# Patient Record
Sex: Female | Born: 1950 | Race: White | Hispanic: No | Marital: Married | Smoking: Never smoker
Health system: Southern US, Community
[De-identification: ages and names within clinical notes are randomized; demographics above are authoritative.]

## PROBLEM LIST (undated history)

## (undated) DIAGNOSIS — F419 Anxiety disorder, unspecified: Secondary | ICD-10-CM

## (undated) HISTORY — PX: COLON SURGERY: SHX602

## (undated) HISTORY — PX: TUBAL LIGATION: SHX77

## (undated) HISTORY — DX: Anxiety disorder, unspecified: F41.9

---

## 2002-11-29 ENCOUNTER — Encounter: Payer: Self-pay | Admitting: Orthopedic Surgery

## 2002-12-03 ENCOUNTER — Encounter: Payer: Self-pay | Admitting: Orthopedic Surgery

## 2008-01-21 ENCOUNTER — Ambulatory Visit (HOSPITAL_COMMUNITY): Admission: RE | Admit: 2008-01-21 | Discharge: 2008-01-21 | Payer: Self-pay | Admitting: Family Medicine

## 2009-02-28 ENCOUNTER — Ambulatory Visit (HOSPITAL_COMMUNITY): Admission: RE | Admit: 2009-02-28 | Discharge: 2009-02-28 | Payer: Self-pay | Admitting: Family Medicine

## 2009-07-24 ENCOUNTER — Ambulatory Visit: Payer: Self-pay | Admitting: Orthopedic Surgery

## 2009-07-24 DIAGNOSIS — M25819 Other specified joint disorders, unspecified shoulder: Secondary | ICD-10-CM | POA: Insufficient documentation

## 2009-07-24 DIAGNOSIS — M7580 Other shoulder lesions, unspecified shoulder: Secondary | ICD-10-CM

## 2010-02-27 NOTE — Assessment & Plan Note (Signed)
Summary: rt shoulder pain needs xr/cigna/bsf   Vital Signs:  Patient profile:   60 year old female Height:      66 inches Weight:      189 pounds Pulse rate:   78 / minute Resp:     16 per minute  Vitals Entered By: Fuller Canada MD (July 24, 2009 11:52 AM)  Visit Type:  new patient Referring Provider:  self Primary Provider:  Dr. Sudie Bailey  CC:  right shoulder pain.  History of Present Illness: 60 year old female with RIGHT shoulder pain primarily anteriorly associated with internal rotation and certain activities at work which include lifting up to the shoulder level and above  Pain present for 2 months  Pain described as sharp dull throbbing stabbing burning and radiates down into her RIGHT upper forearm and comes and goes appears to be exacerbated by activity  Denies arm weakness denies numbness does complain of some intermittent neck stiffness  Status post RIGHT proximal humerus fracture treated with physical therapy and a sling this was several years ago   Allergies (verified): 1)  ! 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  Review of Systems Constitutional:  Denies weight loss, weight gain, fever, chills, and fatigue. Cardiovascular:  Denies chest pain, palpitations, fainting, and murmurs. Respiratory:  Denies short of breath, wheezing, couch, tightness, pain on inspiration, and snoring . Gastrointestinal:  Complains of heartburn; denies nausea, vomiting, diarrhea, constipation, and blood in your stools. Genitourinary:  Denies frequency, urgency, difficulty urinating, painful urination, flank pain, and bleeding in urine. Neurologic:  Denies numbness, tingling, unsteady gait, dizziness, tremors, and  seizure. Musculoskeletal:  Denies joint pain, swelling, instability, stiffness, redness, heat, and muscle pain. Endocrine:  Denies excessive thirst, exessive urination, and heat or cold intolerance. Psychiatric:  Denies nervousness, depression, anxiety, and hallucinations. Skin:  Denies changes in the skin, poor healing, rash, itching, and redness. HEENT:  Denies blurred or double vision, eye pain, redness, and watering. Immunology:  Denies seasonal allergies, sinus problems, and allergic to bee stings. Hemoatologic:  Denies easy bleeding and brusing.  Physical Exam  Msk:  general appearance was normal  Vital signs are stable   Pulses:  observation and palpation revealed no abnormalities in the RIGHT or LEFT upper extremity pulses   Extremities:  RIGHT upper extremity tenderness at the posterior subacromial space an anterolateral acromion.  A.c. joint was nontender.  Patient had full range of motion with painful forward elevation after 150.  Rotator cuff strength is 5 over 5 with abduction forward elevation and internal and external rotation.  She did have some pain with abduction external rotation as well.  Had n Neurologic:  reflexes and coordination were normal.  No sensory deficits were noted in either arm. Skin:  intact without lesions or rashes Psych:  alert and cooperative; normal mood and affect; normal attention span and concentration   Impression & Recommendations:  Problem # 1:  IMPINGEMENT SYNDROME (ICD-726.2) Assessment New  AP lateral shoulder shows a flat acromion with a normal Roman arch of the shoulder glenohumeral joint normal  Subacromial injection  Recommend range of motion exercises and anti-inflammatory and a pain reliever  Orders: New Patient Level III (04540) Shoulder x-ray,  minimum 2 views (98119) Joint  Aspirate / Injection, Large (20610) Depo- Medrol 40mg  (J1030)  Medications Added to Medication List This Visit: 1)  Norco 5-325 Mg Tabs  (Hydrocodone-acetaminophen) .Marland Kitchen.. 1 q 6 prn pain 2)  Nabumetone 500 Mg Tabs (Nabumetone) .Marland Kitchen.. 1 by mouth two times a day  Patient Instructions: 1)  You have received an injection of cortisone today. You may experience increased pain at the injection site. Apply ice pack to the area for 20 minutes every 2 hours and take 2 xtra strength tylenol every 8 hours. This increased pain will usually resolve in 24 hours. The injection will take effect in 3-10 days.  2)  Diagnosis: Rotator cuff syndrome/bursitis tendonitis  Prescriptions: NABUMETONE 500 MG TABS (NABUMETONE) 1 by mouth two times a day  #60 x 1   Entered and Authorized by:   Fuller Canada MD   Signed by:   Fuller Canada MD on 07/24/2009   Method used:   Print then Give to Patient   RxID:   [id number] NORCO 5-325 MG TABS (HYDROCODONE-ACETAMINOPHEN) 1 q 6 prn pain  #56 x 1   Entered and Authorized by:   Fuller Canada MD   Signed by:   Fuller Canada MD on 07/24/2009   Method used:   Print then Give to Patient   RxID:   [id number]

## 2010-02-27 NOTE — Progress Notes (Signed)
Summary: Progress note  Progress note   Imported By: Jacklynn Ganong 07/24/2009 11:43:25  _____________________________________________________________________  External Attachment:    Type:   Image     Comment:   External Document

## 2010-02-27 NOTE — Letter (Signed)
Summary: History form  History form   Imported By: Jacklynn Ganong 07/25/2009 11:59:04  _____________________________________________________________________  External Attachment:    Type:   Image     Comment:   External Document

## 2010-02-27 NOTE — Progress Notes (Signed)
Summary: Initial evaluation  Initial evaluation   Imported By: Jacklynn Ganong 07/24/2009 11:40:15  _____________________________________________________________________  External Attachment:    Type:   Image     Comment:   External Document

## 2010-03-24 ENCOUNTER — Emergency Department (HOSPITAL_COMMUNITY)
Admission: EM | Admit: 2010-03-24 | Discharge: 2010-03-24 | Disposition: A | Discharge status: Home / Self Care | Attending: Emergency Medicine | Admitting: Emergency Medicine

## 2010-03-24 ENCOUNTER — Emergency Department (HOSPITAL_COMMUNITY)

## 2010-03-24 DIAGNOSIS — W268XXA Contact with other sharp object(s), not elsewhere classified, initial encounter: Secondary | ICD-10-CM | POA: Insufficient documentation

## 2010-03-24 DIAGNOSIS — S61409A Unspecified open wound of unspecified hand, initial encounter: Secondary | ICD-10-CM | POA: Insufficient documentation

## 2010-03-24 DIAGNOSIS — Y9269 Other specified industrial and construction area as the place of occurrence of the external cause: Secondary | ICD-10-CM | POA: Insufficient documentation

## 2010-03-24 DIAGNOSIS — S61209A Unspecified open wound of unspecified finger without damage to nail, initial encounter: Secondary | ICD-10-CM | POA: Insufficient documentation

## 2010-03-24 DIAGNOSIS — W01119A Fall on same level from slipping, tripping and stumbling with subsequent striking against unspecified sharp object, initial encounter: Secondary | ICD-10-CM | POA: Insufficient documentation

## 2012-01-20 ENCOUNTER — Ambulatory Visit (INDEPENDENT_AMBULATORY_CARE_PROVIDER_SITE_OTHER): Payer: Managed Care, Other (non HMO) | Admitting: Physician Assistant

## 2012-01-20 VITALS — BP 114/78 | HR 87 | Temp 98.0°F | Resp 16 | Ht 67.0 in | Wt 184.6 lb

## 2012-01-20 DIAGNOSIS — R059 Cough, unspecified: Secondary | ICD-10-CM

## 2012-01-20 DIAGNOSIS — J069 Acute upper respiratory infection, unspecified: Secondary | ICD-10-CM

## 2012-01-20 DIAGNOSIS — R05 Cough: Secondary | ICD-10-CM

## 2012-01-20 MED ORDER — HYDROCOD POLST-CHLORPHEN POLST 10-8 MG/5ML PO LQCR: 5.0000 mL | Freq: Two times a day (BID) | ORAL | Status: DC | PRN

## 2012-01-20 MED ORDER — IPRATROPIUM BROMIDE 0.03 % NA SOLN: 2.0000 | Freq: Two times a day (BID) | NASAL | Status: DC

## 2012-01-20 MED ORDER — AMOXICILLIN 875 MG PO TABS: 1750.0000 mg | ORAL_TABLET | Freq: Two times a day (BID) | ORAL | Status: DC

## 2012-01-20 NOTE — Patient Instructions (Signed)
Get plenty of rest and drink at least 64 ounces of water daily. 

## 2012-01-20 NOTE — Progress Notes (Signed)
  Subjective:    Patient ID: Amber Garcia, female    DOB: [birth year], 60 y.o.   MRN: [id number]  HPI This 61 y.o. female presents for evaluation of a cough 5 days ago.  Occasionally productive of foul tasting mucous.  Developed fever yesterday, TMax 101.  Has developed a sore throat with coughing, minimal nasal congestion.  No GU symptoms.  Had some mild dysuria yesterday, but resolved with cranberry juice.   Past Medical History  Diagnosis Date  . Anxiety     Past Surgical History  Procedure Date  . Tubal ligation   . Cesarean section     x2    Prior to Admission medications   Medication Sig Start Date End Date Taking? Authorizing Provider  dextromethorphan 15 MG/5ML syrup Take 10 mLs by mouth 4 (four) times daily as needed.   Yes Historical Provider, MD  dextromethorphan-guaiFENesin (MUCINEX DM) 30-600 MG per 12 hr tablet Take 1 tablet by mouth every 12 (twelve) hours.   Yes Historical Provider, MD  ibuprofen (ADVIL,MOTRIN) 200 MG tablet Take 200 mg by mouth every 6 (six) hours as needed.   Yes Historical Provider, MD  LORazepam (ATIVAN) 1 MG tablet Take 1 mg by mouth every 8 (eight) hours.   Yes Historical Provider, MD    Allergies  Allergen Reactions  . Influenza Vaccines     Severe local reaction    History   Social History  . Marital Status: Married    Spouse Name: Karren Burly    Number of Children: 4  . 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 Smoking status: Never Smoker   . Smokeless tobacco: Never Used  . Alcohol Use: No  . Drug Use: No  . Sexually Active: Yes -- Female partner(s)    Birth Control/ Protection: Surgical, Post-menopausal   Other Topics Concern  . Not on file   Social History Narrative   Lives with her husband and their 5 dogs.    Family History  Problem Relation Age of Onset  . COPD Mother   . Cancer Mother   . Cancer Maternal Grandmother   . Heart disease Maternal Grandfather      Review of Systems As above.    Objective:   Physical Exam  Blood pressure 114/78, pulse 87, temperature 98 F (36.7 C), temperature source Oral, resp. rate 16, height 5\' 7"  (1.702 m), weight 184 lb 9.6 oz (83.734 kg), SpO2 96.00%. Body mass index is 28.91 kg/(m^2). Well-developed, well nourished WF who is awake, alert and oriented, in NAD. Accompanied by her husband, who insisted she come in today. HEENT: /AT, PERRL, EOMI.  Sclera and conjunctiva are clear.  EAC are patent, TMs are normal in appearance. Nasal mucosa is pink and moist. OP is clear. Tenderness with palpation over the maxillary sinuses. Neck: supple, non-tender, no lymphadenopathy, thyromegaly. Heart: RRR, no murmur Lungs: normal effort, CTA Extremities: no cyanosis, clubbing or edema. Skin: warm and dry without rash. Psychologic: good mood and appropriate affect, normal speech and behavior.     Assessment & Plan:   1. Cough  ipratropium (ATROVENT) 0.03 % nasal spray, chlorpheniramine-HYDROcodone (TUSSIONEX PENNKINETIC ER) 10-8 MG/5ML LQCR, amoxicillin (AMOXIL) 875 MG tablet  2. Acute upper respiratory infections of unspecified site  ipratropium (ATROVENT) 0.03 % nasal spray   Supportive care. Anticipatory guidance.

## 2013-12-29 ENCOUNTER — Telehealth: Payer: Self-pay | Admitting: Orthopedic Surgery

## 2013-12-29 NOTE — Telephone Encounter (Signed)
Relayed to patient, per Dr Mort SawyersHarrison's response.  Will do as soon as she receives the information from Department of Labor.

## 2013-12-29 NOTE — Telephone Encounter (Signed)
Let me know when yiu have all the info and this will have to be scheduled on a non clinic day

## 2013-12-29 NOTE — Telephone Encounter (Signed)
Patient called to request an appointment for re-evaluation of work-related injury from approximately 02/14/93, right arm fracture, which she injured on her job with the [employer].  She relates that she had seen Dr Romeo AppleHarrison for this injury at his [facility].  Also states that the Worker's comp case is closed; however, she has been made aware of a possible rating she may qualify for, through the Department of Labor, due to some permanent impairment or limitations from the injury.  She is still employed with the [employer], [employer], and her insurance is [employer] engineer([employer]).  She also states that the union representative for Department of Labor will be sending her the guidelines and criteria.  I relayed to patient that Dr Romeo AppleHarrison will need to review and advise, prior to scheduling appointment.  Patient ph# is [phone].

## 2014-05-11 ENCOUNTER — Ambulatory Visit (INDEPENDENT_AMBULATORY_CARE_PROVIDER_SITE_OTHER): Payer: 59 | Admitting: Orthopedic Surgery

## 2014-05-11 ENCOUNTER — Encounter: Payer: Self-pay | Admitting: Orthopedic Surgery

## 2014-05-11 VITALS — BP 111/76 | Ht 67.0 in | Wt 181.0 lb

## 2014-05-11 DIAGNOSIS — S42201S Unspecified fracture of upper end of right humerus, sequela: Secondary | ICD-10-CM | POA: Diagnosis not present

## 2014-05-11 NOTE — Progress Notes (Signed)
Chief complaint the patient needs reevaluation for an injury sustained regarding her right shoulder which she got a reading from the [employer] [place] industrial commission rating system of 2.5% secondary to fracture  She now needs an AMA guide sixth addition type rating.  She complains of catching locking stiffness giving out burning aching pain in the right shoulder and activities of daily living are difficult such as reaching above her head or carrying weight or pushing weight above her head she has trouble typing or reaching out away from her area she has some trouble supinating her forearm which may or may not be related  She takes naproxen at this point for shoulder pain her pain ranges from 0-10 she been on naproxen the last 3 months prior to that she was on ibuprofen 3-4 tablets at a time  As far as her examination goes we will list this as a right to left comparison  Forward elevation 150/180 External rotation 30/50 Internal rotation T11/T7 Motor strength supraspinatus 4 right/5 left  I will need to get a sixth edition guidelines to rate this based on the above information and any other information which I will save that is pertinent to the case  Additional information which may or not be pertinent review of system redness of her eyes tingling in the arms seasonal allergies anxiety  Flu shot caused some swelling and fever  Family history COPD emphysema pneumonia history of fractures and cancer  No smoking or drinking  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

## 2014-06-14 NOTE — H&P (Signed)
  NTS SOAP Note  Vital Signs:  Vitals as of: 06/14/2014: Systolic 123: Diastolic 74: Heart Rate 76: Temp 97.41F: Height 335ft 7in: Weight 175Lbs 0 Ounces: BMI 27.41  BMI : 27.41 kg/m2  Subjective: This 64 year old female presents for of need for screening TCS.  Never has had one.  No family h/o colon carcinoma.  Denies any gi complaints.  Review of Symptoms:  Constitutional:unremarkable   Head:unremarkable Eyes:unremarkable   sinus problems Cardiovascular:  unremarkable Respiratory:unremarkable Gastrointestinal:  unremarkable   Genitourinary:unremarkable   Musculoskeletal:unremarkable Skin:unremarkable Hematolgic/Lymphatic:unremarkable   Allergic/Immunologic:unremarkable   Past Medical History:  Reviewed  Past Medical History  Surgical History: c-sections Medical Problems: high cholesterol, anxiety disorder Allergies: flu shot Medications: lorazepam, naproxen   Social History:Reviewed  Social History  Preferred Language: English Race:  White Ethnicity: Not Hispanic / Latino Age: [age] year Marital Status:  M Alcohol: no   Smoking Status: Never smoker reviewed on 06/14/2014 Functional Status reviewed on 06/14/2014 ------------------------------------------------ Bathing: Normal Cooking: Normal Dressing: Normal Driving: Normal Eating: Normal Managing Meds: Normal Oral Care: Normal Shopping: Normal Toileting: Normal Transferring: Normal Walking: Normal Cognitive Status reviewed on 06/14/2014 ------------------------------------------------ Attention: Normal Decision Making: Normal Language: Normal Memory: Normal Motor: Normal Perception: Normal Problem Solving: Normal Visual and Spatial: Normal   Family History:Reviewed  Family Health History Family History is Unknown    Objective Information: General:Well appearing, well nourished in no distress. Heart:RRR, no murmur Lungs:  CTA bilaterally, no wheezes, rhonchi,  rales.  Breathing unlabored. Abdomen:Soft, NT/ND, no HSM, no masses. deferred to procedure  Assessment:Need for screening TCS  Diagnoses: V76.51  Z12.11 Screening for malignant neoplasm of colon (Encounter for screening for malignant neoplasm of colon)  Procedures: [id number] - OFFICE OUTPATIENT NEW 20 MINUTES    Plan:  Schedule for TCS on 06/28/14.   Patient Education:Alternative treatments to surgery were discussed with patient (and family).  Risks and benefits  of procedure including bleeding and perforation were fully explained to the patient (and family) who gave informed consent. Patient/family questions were addressed.  Follow-up:Pending Surgery

## 2014-06-28 ENCOUNTER — Ambulatory Visit (HOSPITAL_COMMUNITY)
Admission: RE | Admit: 2014-06-28 | Discharge: 2014-06-28 | Disposition: A | Discharge status: Home / Self Care | Payer: 59 | Source: Ambulatory Visit | Attending: General Surgery | Admitting: General Surgery

## 2014-06-28 ENCOUNTER — Encounter (HOSPITAL_COMMUNITY)
Admission: RE | Disposition: A | Discharge status: Home / Self Care | Payer: Self-pay | Source: Ambulatory Visit | Attending: General Surgery

## 2014-06-28 ENCOUNTER — Encounter (HOSPITAL_COMMUNITY): Payer: Self-pay | Admitting: *Deleted

## 2014-06-28 DIAGNOSIS — Z1211 Encounter for screening for malignant neoplasm of colon: Secondary | ICD-10-CM | POA: Insufficient documentation

## 2014-06-28 DIAGNOSIS — F419 Anxiety disorder, unspecified: Secondary | ICD-10-CM | POA: Diagnosis not present

## 2014-06-28 DIAGNOSIS — K5730 Diverticulosis of large intestine without perforation or abscess without bleeding: Secondary | ICD-10-CM | POA: Diagnosis not present

## 2014-06-28 DIAGNOSIS — Z800 Family history of malignant neoplasm of digestive organs: Secondary | ICD-10-CM | POA: Insufficient documentation

## 2014-06-28 DIAGNOSIS — Z791 Long term (current) use of non-steroidal anti-inflammatories (NSAID): Secondary | ICD-10-CM | POA: Diagnosis not present

## 2014-06-28 DIAGNOSIS — Z79899 Other long term (current) drug therapy: Secondary | ICD-10-CM | POA: Diagnosis not present

## 2014-06-28 HISTORY — PX: COLONOSCOPY: SHX5424

## 2014-06-28 SURGERY — COLONOSCOPY
Anesthesia: Moderate Sedation

## 2014-06-28 MED ORDER — MIDAZOLAM HCL 5 MG/5ML IJ SOLN
INTRAMUSCULAR | Status: DC | PRN
  Administered 2014-06-28: 3 mg via INTRAVENOUS

## 2014-06-28 MED ORDER — MIDAZOLAM HCL 5 MG/5ML IJ SOLN
INTRAMUSCULAR | Status: AC
  Filled 2014-06-28: qty 10

## 2014-06-28 MED ORDER — SODIUM CHLORIDE 0.9 % IV SOLN
INTRAVENOUS | Status: DC
  Administered 2014-06-28: 08:00:00 via INTRAVENOUS

## 2014-06-28 MED ORDER — MEPERIDINE HCL 50 MG/ML IJ SOLN
INTRAMUSCULAR | Status: AC
  Filled 2014-06-28: qty 1

## 2014-06-28 MED ORDER — MEPERIDINE HCL 50 MG/ML IJ SOLN
INTRAMUSCULAR | Status: DC | PRN
  Administered 2014-06-28: 50 mg via INTRAVENOUS

## 2014-06-28 NOTE — Op Note (Signed)
[facility] [address] [place], [id number]   COLONOSCOPY PROCEDURE REPORT     EXAM DATE: 06/28/2014  PATIENT NAME:      Amber Garcia, Amber Garcia           MR #:      [id number] BIRTHDATE:       Jun 17, 1950      VISIT #:     [id number]  ATTENDING:     Franky MachoMark Enrrique Mierzwa, MD     STATUS:     outpatient ASSISTANT:  INDICATIONS:  The patient is a 64 yr old female here for a colonoscopy due to patient's immediate family history of colon cancer. PROCEDURE PERFORMED:     Colonoscopy, screening MEDICATIONS:     Demerol 50 mg IV and Versed 3 mg IV ESTIMATED BLOOD LOSS:     None  CONSENT: The patient understands the risks and benefits of the procedure and understands that these risks include, but are not limited to: sedation, allergic reaction, infection, perforation and/or bleeding. Alternative means of evaluation and treatment include, among others: physical exam, x-rays, and/or surgical intervention. The patient elects to proceed with this endoscopic procedure.  DESCRIPTION OF PROCEDURE: During intra-op preparation period all mechanical & medical equipment was checked for proper function. Hand hygiene and appropriate measures for infection prevention was taken. After the risks, benefits and alternatives of the procedure were thoroughly explained, Informed consent was verified, confirmed and timeout was successfully executed by the treatment team. A digital exam revealed no abnormalities of the rectum. The EC-[id number] ([id number] endoscope was introduced through the anus and advanced to the cecum, which was identified by both the appendix and ileocecal valve. adequate The instrument was then slowly withdrawn as the colon was fully examined.Estimated blood loss is zero unless otherwise noted in this procedure report.   COLON FINDINGS: There was mild diverticulosis noted throughout the entire examined colon.   The examination was otherwise normal. Retroflexed views revealed no  abnormalities. The scope was then completely withdrawn from the patient and the procedure terminated.  SCOPE WITHDRAWAL TIME: 6    ADVERSE EVENTS:      There were no immediate complications.  IMPRESSIONS:     1.  Mild diverticulosis was noted throughout the entire examined colon 2.  The examination was otherwise normal  RECOMMENDATIONS:     Repeat Colonoscopy in 5 years. RECALL:  _____________________________ Franky MachoMark Felesia Stahlecker, MD eSigned:  Franky MachoMark Daritza Brees, MD 06/28/2014 8:36 AM   cc:   CPT CODES: ICD CODES:  The ICD and CPT codes recommended by this software are interpretations from the data that the clinical staff has captured with the software.  The verification of the translation of this report to the ICD and CPT codes and modifiers is the sole responsibility of the health care institution and practicing physician where this report was generated.  [employer] will not be held responsible for the validity of the ICD and CPT codes included on this report.  AMA assumes no liability for data contained or not contained herein. CPT is a Publishing rights managerregistered trademark of the [employer].

## 2014-06-28 NOTE — Interval H&P Note (Signed)
History and Physical Interval Note:  06/28/2014 8:18 AM  Amber Garcia  has presented today for surgery, with the diagnosis of screening  The various methods of treatment have been discussed with the patient and family. After consideration of risks, benefits and other options for treatment, the patient has consented to  Procedure(s): COLONOSCOPY (N/A) as a surgical intervention .  The patient's history has been reviewed, patient examined, no change in status, stable for surgery.  I have reviewed the patient's chart and labs.  Questions were answered to the patient's satisfaction.     Franky MachoJENKINS,Artesia Berkey A

## 2014-06-28 NOTE — Discharge Instructions (Signed)
Diverticulosis °Diverticulosis is the condition that develops when small pouches (diverticula) form in the wall of your colon. Your colon, or large intestine, is where water is absorbed and stool is formed. The pouches form when the inside layer of your colon pushes through weak spots in the outer layers of your colon. °CAUSES  °No one knows exactly what causes diverticulosis. °RISK FACTORS °· Being older than 50. Your risk for this condition increases with age. Diverticulosis is rare in people younger than 40 years. By age 80, almost everyone has it. °· Eating a low-fiber diet. °· Being frequently constipated. °· Being overweight. °· Not getting enough exercise. °· Smoking. °· Taking over-the-counter pain medicines, like aspirin and ibuprofen. °SYMPTOMS  °Most people with diverticulosis do not have symptoms. °DIAGNOSIS  °Because diverticulosis often has no symptoms, health care providers often discover the condition during an exam for other colon problems. In many cases, a health care provider will diagnose diverticulosis while using a flexible scope to examine the colon (colonoscopy). °TREATMENT  °If you have never developed an infection related to diverticulosis, you may not need treatment. If you have had an infection before, treatment may include: °· Eating more fruits, vegetables, and grains. °· Taking a fiber supplement. °· Taking a live bacteria supplement (probiotic). °· Taking medicine to relax your colon. °HOME CARE INSTRUCTIONS  °· Drink at least 6-8 glasses of water each day to prevent constipation. °· Try not to strain when you have a bowel movement. °· Keep all follow-up appointments. °If you have had an infection before:  °· Increase the fiber in your diet as directed by your health care provider or dietitian. °· Take a dietary fiber supplement if your health care provider approves. °· Only take medicines as directed by your health care provider. °SEEK MEDICAL CARE IF:  °· You have abdominal  pain. °· You have bloating. °· You have cramps. °· You have not gone to the bathroom in 3 days. °SEEK IMMEDIATE MEDICAL CARE IF:  °· Your pain gets worse. °· Your bloating becomes very bad. °· You have a fever or chills, and your symptoms suddenly get worse. °· You begin vomiting. °· You have bowel movements that are bloody or black. °MAKE SURE YOU: °· Understand these instructions. °· Will watch your condition. °· Will get help right away if you are not doing well or get worse. °Document Released: 10/12/2003 Document Revised: 01/19/2013 Document Reviewed: 12/09/2012 °ExitCare® Patient Information ©2015 [employer]. This information is not intended to replace advice given to you by your health care provider. Make sure you discuss any questions you have with your health care provider. °Colonoscopy, Care After °Refer to this sheet in the next few weeks. These instructions provide you with information on caring for yourself after your procedure. Your health care provider may also give you more specific instructions. Your treatment has been planned according to current medical practices, but problems sometimes occur. Call your health care provider if you have any problems or questions after your procedure. °WHAT TO EXPECT AFTER THE PROCEDURE  °After your procedure, it is typical to have the following: °· A small amount of blood in your stool. °· Moderate amounts of gas and mild abdominal cramping or bloating. °HOME CARE INSTRUCTIONS °· Do not drive, operate machinery, or sign important documents for 24 hours. °· You may shower and resume your regular physical activities, but move at a slower pace for the first 24 hours. °· Take frequent rest periods for the first 24 hours. °· Walk   around or put a warm pack on your abdomen to help reduce abdominal cramping and bloating. °· Drink enough fluids to keep your urine clear or pale yellow. °· You may resume your normal diet as instructed by your health care provider. Avoid  heavy or fried foods that are hard to digest. °· Avoid drinking alcohol for 24 hours or as instructed by your health care provider. °· Only take over-the-counter or prescription medicines as directed by your health care provider. °· If a tissue sample (biopsy) was taken during your procedure: °¨ Do not take aspirin or blood thinners for 7 days, or as instructed by your health care provider. °¨ Do not drink alcohol for 7 days, or as instructed by your health care provider. °¨ Eat soft foods for the first 24 hours. °SEEK MEDICAL CARE IF: °You have persistent spotting of blood in your stool 2-3 days after the procedure. °SEEK IMMEDIATE MEDICAL CARE IF: °· You have more than a small spotting of blood in your stool. °· You pass large blood clots in your stool. °· Your abdomen is swollen (distended). °· You have nausea or vomiting. °· You have a fever. °· You have increasing abdominal pain that is not relieved with medicine. °Document Released: 08/29/2003 Document Revised: 11/04/2012 Document Reviewed: 09/21/2012 °ExitCare® Patient Information ©2015 [employer]. This information is not intended to replace advice given to you by your health care provider. Make sure you discuss any questions you have with your health care provider. ° °

## 2014-06-29 ENCOUNTER — Encounter (HOSPITAL_COMMUNITY): Payer: Self-pay | Admitting: General Surgery

## 2014-10-04 ENCOUNTER — Telehealth: Payer: Self-pay | Admitting: Orthopedic Surgery

## 2014-10-04 NOTE — Telephone Encounter (Signed)
-----   Message from Vickki Hearing, MD sent at 09/01/2014  9:37 AM EDT ----- Regarding: RE: Any further info or letter?  No did not get a new book  ----- Message -----    From: Doristine Section    Sent: 08/31/2014   3:25 PM      To: Vickki Hearing, MD Subject: FW: Any further info or letter?                Dr Romeo Apple,  [name] called back again (6/20,  and today, 08/31/14, to follow up about rating.  Aurea Graff thought you received the new book).  Please advise.    Thanks, Okey Regal ----- Message -----    From: Vickki Hearing, MD    Sent: 07/18/2014   4:23 PM      To: Pecolia Ades Subject: RE: Any further info or letter? (pt called b#  Can you remind me to ask joan to get me this book  ----- Message -----    From: Doristine Section    Sent: 07/18/2014   2:28 PM      To: Vickki Hearing, MD Subject: RE: Any further info or letter? (pt called b#  Dr Romeo Apple,  Patient called back about this.  Asking if you would be getting any other information re: 6th edition book.  Same answer as 05/24/14?  Thank you, Okey Regal ----- Message -----    From: Vickki Hearing, MD    Sent: 05/24/2014  12:04 PM      To: Doristine Section Subject: RE: Any further info or letter?                ITS ALL IN THE CHART WHAT I COULD DO   I DO NOT HAVE A 6TH EDITION RATING BOOK AND HAVE TO WAIT TO GET ONE  ----- Message -----    From: Doristine Section    Sent: 05/24/2014   9:08 AM      To: Vickki Hearing, MD Subject: Any further info or letter?                    Dr Romeo Apple, [name] [[phone]]  Is there a letter or anything further due this patient related to rating? Thanks, Okey Regal

## 2014-10-04 NOTE — Telephone Encounter (Signed)
Patient called to follow up on status on rating per Workers comp's updated book; please advise.  Patient's cell ph# [phone]

## 2014-10-06 NOTE — Telephone Encounter (Signed)
Called patient, relayed per Dr Romeo Apple, that request will be completed by next Wednesday, 10/12/14.

## 2014-10-06 NOTE — Telephone Encounter (Signed)
i will have it done by next weds pm

## 2014-10-12 ENCOUNTER — Encounter: Payer: Self-pay | Admitting: Orthopedic Surgery

## 2015-02-12 ENCOUNTER — Ambulatory Visit (INDEPENDENT_AMBULATORY_CARE_PROVIDER_SITE_OTHER): Payer: Managed Care, Other (non HMO) | Admitting: Physician Assistant

## 2015-02-12 ENCOUNTER — Ambulatory Visit (INDEPENDENT_AMBULATORY_CARE_PROVIDER_SITE_OTHER): Payer: Managed Care, Other (non HMO)

## 2015-02-12 VITALS — BP 128/68 | HR 99 | Temp 102.8°F | Resp 16 | Ht 66.0 in | Wt 159.0 lb

## 2015-02-12 DIAGNOSIS — R6883 Chills (without fever): Secondary | ICD-10-CM | POA: Diagnosis not present

## 2015-02-12 DIAGNOSIS — R0981 Nasal congestion: Secondary | ICD-10-CM | POA: Diagnosis not present

## 2015-02-12 DIAGNOSIS — R05 Cough: Secondary | ICD-10-CM | POA: Diagnosis not present

## 2015-02-12 DIAGNOSIS — R509 Fever, unspecified: Secondary | ICD-10-CM

## 2015-02-12 DIAGNOSIS — R5381 Other malaise: Secondary | ICD-10-CM | POA: Diagnosis not present

## 2015-02-12 DIAGNOSIS — J111 Influenza due to unidentified influenza virus with other respiratory manifestations: Secondary | ICD-10-CM | POA: Diagnosis not present

## 2015-02-12 DIAGNOSIS — N390 Urinary tract infection, site not specified: Secondary | ICD-10-CM

## 2015-02-12 DIAGNOSIS — R5383 Other fatigue: Secondary | ICD-10-CM | POA: Diagnosis not present

## 2015-02-12 DIAGNOSIS — R69 Illness, unspecified: Principal | ICD-10-CM

## 2015-02-12 LAB — POCT CBC
Granulocyte percent: 84.8 %G — AB (ref 37–80)
HEMATOCRIT: 38 % (ref 37.7–47.9)
Hemoglobin: 12.9 g/dL (ref 12.2–16.2)
Lymph, poc: 1 (ref 0.6–3.4)
MCH, POC: 29.4 pg (ref 27–31.2)
MCHC: 34 g/dL (ref 31.8–35.4)
MCV: 86.5 fL (ref 80–97)
MID (CBC): 0.2 (ref 0–0.9)
MPV: 6.5 fL (ref 0–99.8)
POC Granulocyte: 6.4 (ref 2–6.9)
POC LYMPH %: 13.1 % (ref 10–50)
POC MID %: 2.1 %M (ref 0–12)
Platelet Count, POC: 155 10*3/uL (ref 142–424)
RBC: 4.4 M/uL (ref 4.04–5.48)
RDW, POC: 12.9 %
WBC: 7.5 10*3/uL (ref 4.6–10.2)

## 2015-02-12 LAB — POCT URINALYSIS DIP (MANUAL ENTRY)
Bilirubin, UA: NEGATIVE
Blood, UA: NEGATIVE
GLUCOSE UA: NEGATIVE
Nitrite, UA: POSITIVE — AB
SPEC GRAV UA: 1.02
Urobilinogen, UA: 0.2
pH, UA: 6.5

## 2015-02-12 LAB — POC MICROSCOPIC URINALYSIS (UMFC): MUCUS RE: ABSENT

## 2015-02-12 LAB — POCT INFLUENZA A/B
Influenza A, POC: NEGATIVE
Influenza B, POC: NEGATIVE

## 2015-02-12 MED ORDER — ACETAMINOPHEN 325 MG PO TABS
1000.0000 mg | ORAL_TABLET | Freq: Once | ORAL | Status: AC
  Administered 2015-02-12: 1000 mg via ORAL

## 2015-02-12 MED ORDER — CIPROFLOXACIN HCL 500 MG PO TABS: 500.0000 mg | ORAL_TABLET | Freq: Two times a day (BID) | ORAL | Status: DC

## 2015-02-12 MED ORDER — CEFTRIAXONE SODIUM 1 G IJ SOLR
1.0000 g | Freq: Once | INTRAMUSCULAR | Status: AC
  Administered 2015-02-12: 1 g via INTRAMUSCULAR

## 2015-02-12 MED ORDER — OSELTAMIVIR PHOSPHATE 75 MG PO CAPS: 75.0000 mg | ORAL_CAPSULE | Freq: Two times a day (BID) | ORAL | Status: DC

## 2015-02-12 NOTE — Progress Notes (Signed)
02/12/2015 10:54 AM   DOB: 1950-07-14 / MRN: [id number]  SUBJECTIVE:  Amber Garcia is a 65 y.o. female presenting for headache, cough, myalgia and fever that started 4 days ago.  She has tried pushing PO fluids and OTC remedies without relief.  She has not had the seasonal flu vaccine and reports that she has an allergy to flu shots.    Reports she has been having some dysuria which resolved on Wednesday with drinking cranberry juice.  No urgency, no frequency.    She is allergic to influenza vaccines.   She  has a past medical history of Anxiety.    She  reports that she has never smoked. She has never used smokeless tobacco. She reports that she does not drink alcohol or use illicit drugs. She  reports that she currently engages in sexual activity and has had female partners. She reports using the following methods of birth control/protection: Surgical and Post-menopausal. The patient  has past surgical history that includes Tubal ligation; Cesarean section; Colonoscopy (N/A, 06/28/2014); and Colon surgery.  Her family history includes COPD in her mother; Cancer in her maternal grandmother and mother; Heart disease in her maternal grandfather.  Review of Systems  Constitutional: Positive for fever, chills and malaise/fatigue. Negative for diaphoresis.  HENT: Positive for congestion. Negative for sore throat.   Respiratory: Positive for cough. Negative for hemoptysis, shortness of breath and wheezing.   Cardiovascular: Negative for chest pain.  Gastrointestinal: Negative for nausea.  Skin: Negative for rash.  Neurological: Negative for dizziness and weakness.  Endo/Heme/Allergies: Negative for polydipsia.    Problem list and medications reviewed and updated by myself where necessary, and exist elsewhere in the encounter.   OBJECTIVE:  BP 128/68 mmHg  Pulse 99  Temp(Src) 102.8 F (39.3 C) (Oral)  Resp 16  Ht 5\' 6"  (1.676 m)  Wt 159 lb (72.122 kg)  BMI 25.68 kg/m2  SpO2  97%  Physical Exam  Constitutional: She is oriented to person, place, and time. She appears well-nourished. No distress.  HENT:  Mouth/Throat: Oropharynx is clear and moist. No oropharyngeal exudate.  Eyes: EOM are normal. Pupils are equal, round, and reactive to light.  Cardiovascular: Normal rate and regular rhythm.   Pulmonary/Chest: Effort normal. No respiratory distress. She has no wheezes. She has no rales. She exhibits no tenderness.  Abdominal: She exhibits no distension.  Neurological: She is alert and oriented to person, place, and time. No cranial nerve deficit. Gait normal.  Skin: Skin is warm and dry. She is not diaphoretic.  Psychiatric: She has a normal mood and affect.  Vitals reviewed.   Results for orders placed or performed in visit on 02/12/15 (from the past 48 hour(s))  POCT Influenza A/B     Status: None   Collection Time: 02/12/15 10:08 AM  Result Value Ref Range   Influenza A, POC Negative Negative   Influenza B, POC Negative Negative  POCT CBC     Status: Abnormal   Collection Time: 02/12/15 10:20 AM  Result Value Ref Range   WBC 7.5 4.6 - 10.2 K/uL   Lymph, poc 1.0 0.6 - 3.4   POC LYMPH PERCENT 13.1 10 - 50 %L   MID (cbc) 0.2 0 - 0.9   POC MID % 2.1 0 - 12 %M   POC Granulocyte 6.4 2 - 6.9   Granulocyte percent 84.8 (A) 37 - 80 %G   RBC 4.40 4.04 - 5.48 M/uL   Hemoglobin 12.9 12.2 - 16.2  g/dL   HCT, POC 16.1 09.6 - 47.9 %   MCV 86.5 80 - 97 fL   MCH, POC 29.4 27 - 31.2 pg   MCHC 34.0 31.8 - 35.4 g/dL   RDW, POC 04.5 %   Platelet Count, POC 155 142 - 424 K/uL   MPV 6.5 0 - 99.8 fL  POCT urinalysis dipstick     Status: Abnormal   Collection Time: 02/12/15 10:20 AM  Result Value Ref Range   Color, UA yellow yellow   Clarity, UA cloudy (A) clear   Glucose, UA negative negative   Bilirubin, UA negative negative   Ketones, POC UA small (15) (A) negative   Spec Grav, UA 1.020    Blood, UA negative negative   pH, UA 6.5    Protein Ur, POC trace (A)  negative   Urobilinogen, UA 0.2    Nitrite, UA Positive (A) Negative   Leukocytes, UA small (1+) (A) Negative  POCT Microscopic Urinalysis ([facility])     Status: Abnormal   Collection Time: 02/12/15 10:44 AM  Result Value Ref Range   WBC,UR,HPF,POC Too numerous to count  (A) None WBC/hpf   RBC,UR,HPF,POC Few (A) None RBC/hpf   Bacteria Many (A) None, Too numerous to count   Mucus Absent Absent   Epithelial Cells, UR Per Microscopy Few (A) None, Too numerous to count cells/hpf   [facility] reading (PRIMARY) by  PA Raine Blodgett: Lungs clear.  Heart and mediastinal contours within normal limits.   ASSESSMENT AND PLAN  Amber Garcia was seen today for cough, fever, chills, headache and generalized body aches.  Diagnoses and all orders for this visit:  Influenza-like illness -     POCT Influenza A/B  Fever, unspecified fever cause -     acetaminophen (TYLENOL) tablet 975 mg; Take 3 tablets (975 mg total) by mouth once. -     POCT CBC -     DG Chest 2 View; Future -     POCT urinalysis dipstick -     cefTRIAXone (ROCEPHIN) injection 1 g; Inject 1 g into the muscle once.  UTI (lower urinary tract infection): Urine indicates a florid UTI.  She is asymptomatic.  Advised she return tomorrow for a recheck.  -     POCT Microscopic Urinalysis ([facility]) -     cefTRIAXone (ROCEPHIN) injection 1 g; Inject 1 g into the muscle once. -     Urine culture  Other orders -     oseltamivir (TAMIFLU) 75 MG capsule; Take 1 capsule (75 mg total) by mouth 2 (two) times daily. -     ciprofloxacin (CIPRO) 500 MG tablet; Take 1 tablet (500 mg total) by mouth 2 (two) times daily.    The patient was advised to call or return to clinic if she does not see an improvement in symptoms or to seek the care of the closest emergency department if she worsens with the above plan.   Deliah Boston, MHS, PA-C [facility] 02/12/2015 10:54 AM

## 2015-02-13 ENCOUNTER — Ambulatory Visit (INDEPENDENT_AMBULATORY_CARE_PROVIDER_SITE_OTHER): Payer: Managed Care, Other (non HMO) | Admitting: Physician Assistant

## 2015-02-13 VITALS — BP 138/80 | HR 84 | Temp 98.5°F | Resp 16 | Ht 66.0 in | Wt 156.0 lb

## 2015-02-13 DIAGNOSIS — R300 Dysuria: Secondary | ICD-10-CM | POA: Diagnosis not present

## 2015-02-13 DIAGNOSIS — R05 Cough: Secondary | ICD-10-CM | POA: Diagnosis not present

## 2015-02-13 DIAGNOSIS — E860 Dehydration: Secondary | ICD-10-CM | POA: Diagnosis not present

## 2015-02-13 DIAGNOSIS — R059 Cough, unspecified: Secondary | ICD-10-CM

## 2015-02-13 LAB — POCT URINALYSIS DIP (MANUAL ENTRY)
Blood, UA: NEGATIVE
Glucose, UA: NEGATIVE
LEUKOCYTES UA: NEGATIVE
Nitrite, UA: NEGATIVE
Spec Grav, UA: >=1.03
UROBILINOGEN UA: 0.2
pH, UA: 5.5

## 2015-02-13 LAB — POC MICROSCOPIC URINALYSIS (UMFC): Mucus: ABSENT

## 2015-02-13 LAB — POCT CBC
GRANULOCYTE PERCENT: 81.9 % — AB (ref 37–80)
HEMATOCRIT: 42.3 % (ref 37.7–47.9)
Hemoglobin: 14.4 g/dL (ref 12.2–16.2)
Lymph, poc: 1.1 (ref 0.6–3.4)
MCH: 29.2 pg (ref 27–31.2)
MCHC: 34.1 g/dL (ref 31.8–35.4)
MCV: 85.6 fL (ref 80–97)
MID (CBC): 0.5 (ref 0–0.9)
MPV: 6.7 fL (ref 0–99.8)
POC GRANULOCYTE: 7 — AB (ref 2–6.9)
POC LYMPH PERCENT: 12.8 %L (ref 10–50)
POC MID %: 5.3 % (ref 0–12)
Platelet Count, POC: 166 10*3/uL (ref 142–424)
RBC: 4.95 M/uL (ref 4.04–5.48)
RDW, POC: 12.8 %
WBC: 8.6 10*3/uL (ref 4.6–10.2)

## 2015-02-13 MED ORDER — HYDROCODONE-HOMATROPINE 5-1.5 MG/5ML PO SYRP: 2.5000 mL | ORAL_SOLUTION | Freq: Every day | ORAL | Status: AC

## 2015-02-13 NOTE — Progress Notes (Signed)
  Medical screening examination/treatment/procedure(s) were performed by non-physician practitioner and as supervising physician I was immediately available for consultation/collaboration.     

## 2015-02-13 NOTE — Progress Notes (Signed)
02/13/2015 2:30 PM   DOB: [birth year] / MRN: [id number]  SUBJECTIVE:  Amber Garcia is a 65 y.o. female presenting for a recheck of UTI and flu like illness.  Presented yesterday with fever of 102, myalgia, HA.  Negative for the flu.  Given negative flu she was screened and CBC positive for left shift.  Urine showed positive nitrites.  She was given 1 g IM Ceftriaxone and was treated with Tamiflu and Cipro.  Today she reports feeling much better.  She is not pushing PO fluids and felt somewhat dizzy after the shower this morning, however this quickly resolved. She does not want IV fluids today and will drink more fluid.   She is allergic to influenza vaccines.   She  has a past medical history of Anxiety.    She  reports that she has never smoked. She has never used smokeless tobacco. She reports that she does not drink alcohol or use illicit drugs. She  reports that she currently engages in sexual activity and has had female partners. She reports using the following methods of birth control/protection: Surgical and Post-menopausal. The patient  has past surgical history that includes Tubal ligation; Cesarean section; Colonoscopy (N/A, 06/28/2014); and Colon surgery.  Her family history includes COPD in her mother; Cancer in her maternal grandmother and mother; Heart disease in her maternal grandfather.  Review of Systems  Constitutional: Negative for fever.  Respiratory: Negative for cough.   Gastrointestinal: Negative for nausea.  Genitourinary: Negative for dysuria, urgency and frequency.  Musculoskeletal: Negative for myalgias.  Skin: Negative for rash.  Neurological: Negative for headaches.    Problem list and medications reviewed and updated by myself where necessary, and exist elsewhere in the encounter.   OBJECTIVE:  BP 138/80 mmHg  Pulse 84  Temp(Src) 98.5 F (36.9 C) (Oral)  Resp 16  Ht 5\' 6"  (1.676 m)  Wt 156 lb (70.761 kg)  BMI 25.19 kg/m2  SpO2 97%  Physical Exam    Constitutional: She is oriented to person, place, and time. She appears well-nourished. No distress.  Eyes: EOM are normal. Pupils are equal, round, and reactive to light.  Cardiovascular: Normal rate.   Pulmonary/Chest: Effort normal.  Abdominal: She exhibits no distension.  Neurological: She is alert and oriented to person, place, and time. No cranial nerve deficit. Gait normal.  Skin: Skin is dry. She is not diaphoretic.  Psychiatric: She has a normal mood and affect.  Vitals reviewed.   Results for orders placed or performed in visit on 02/13/15 (from the past 48 hour(s))  POCT Microscopic Urinalysis ([facility])     Status: Abnormal   Collection Time: 02/13/15  1:31 PM  Result Value Ref Range   WBC,UR,HPF,POC Few (A) None WBC/hpf   RBC,UR,HPF,POC None None RBC/hpf   Bacteria None None, Too numerous to count   Mucus Absent Absent   Epithelial Cells, UR Per Microscopy Few (A) None, Too numerous to count cells/hpf  POCT urinalysis dipstick     Status: Abnormal   Collection Time: 02/13/15  1:31 PM  Result Value Ref Range   Color, UA other (A) yellow    Comment: Dark Yellow   Clarity, UA clear clear   Glucose, UA negative negative   Bilirubin, UA small (A) negative   Ketones, POC UA >= (160) (A) negative   Spec Grav, UA >=1.030    Blood, UA negative negative   pH, UA 5.5    Protein Ur, POC =30 (A) negative   Urobilinogen,  UA 0.2    Nitrite, UA Negative Negative   Leukocytes, UA Negative Negative  POCT CBC     Status: Abnormal   Collection Time: 02/13/15  1:31 PM  Result Value Ref Range   WBC 8.6 4.6 - 10.2 K/uL   Lymph, poc 1.1 0.6 - 3.4   POC LYMPH PERCENT 12.8 10 - 50 %L   MID (cbc) 0.5 0 - 0.9   POC MID % 5.3 0 - 12 %M   POC Granulocyte 7.0 (A) 2 - 6.9   Granulocyte percent 81.9 (A) 37 - 80 %G   RBC 4.95 4.04 - 5.48 M/uL   Hemoglobin 14.4 12.2 - 16.2 g/dL   HCT, POC 16.1 09.6 - 47.9 %   MCV 85.6 80 - 97 fL   MCH, POC 29.2 27 - 31.2 pg   MCHC 34.1 31.8 - 35.4 g/dL    RDW, POC 04.5 %   Platelet Count, POC 166 142 - 424 K/uL   MPV 6.7 0 - 99.8 fL   Results for HAYDE, KILGOUR (MRN [id number]) as of 02/13/2015 14:25  Ref. Range 02/12/2015 10:20 02/13/2015 13:31  WBC Latest Ref Range: 4.6-10.2 K/uL 7.5 8.6  RBC Latest Ref Range: 4.04-5.48 M/uL 4.40 4.95  Hemoglobin Latest Ref Range: 12.2-16.2 g/dL 78.2 95.6  HCT Latest Ref Range: 37.7-47.9 % 38.0 42.3  MCV Latest Ref Range: 80-97 fL 86.5 85.6  MCH Latest Ref Range: 27-31.2 pg 29.4 29.2  MCHC Latest Ref Range: 31.8-35.4 g/dL 21.3 08.6  MPV Latest Ref Range: 0-99.8 fL 6.5 6.7  Granulocyte percent Latest Ref Range: 37-80 %G 84.8 (A) 81.9 (A)    ASSESSMENT AND PLAN  [name] was seen today for follow-up.  Diagnoses and all orders for this visit:  UTI recheck -     POCT Microscopic Urinalysis ([facility]) -     POCT urinalysis dipstick -     POCT CBC  Cough -     HYDROcodone-homatropine (HYCODAN) 5-1.5 MG/5ML syrup; Take 2.5-5 mLs by mouth at bedtime.  Dehydration: She does not have any nausea. She does not want an IV.  She will hydrate.      The patient was advised to call or return to clinic if she does not see an improvement in symptoms or to seek the care of the closest emergency department if she worsens with the above plan.   Deliah Boston, MHS, PA-C [facility] 02/13/2015 2:30 PM

## 2015-02-14 LAB — URINE CULTURE: Colony Count: >=100000

## 2015-02-15 ENCOUNTER — Encounter: Payer: Self-pay | Admitting: *Deleted

## 2015-02-15 ENCOUNTER — Telehealth: Payer: Self-pay

## 2015-02-15 NOTE — Telephone Encounter (Signed)
Pt. Called in to ask for an extra day because she was not still feeling well. I verified with the TL if it was ok she said it was ok and gave her the extra day. We informed her that she has to return if she is still not better if she needed more time.

## 2015-05-10 ENCOUNTER — Telehealth: Payer: Self-pay | Admitting: Orthopedic Surgery

## 2015-05-10 NOTE — Telephone Encounter (Signed)
Left message with patient to return call regarding form/medical request per [employer], related to work-related injury from 02/14/1993, and as per previous notes, was treated by Dr Romeo AppleHarrison in previous location, and most recently seen 05/11/14, and forms completed at that time (copy in scanned documents). * *Patient returned call - relayed that Dr Romeo AppleHarrison responded to the request and form was mailed today, 05/10/15, as well as copy to patient.

## 2016-01-30 ENCOUNTER — Ambulatory Visit (INDEPENDENT_AMBULATORY_CARE_PROVIDER_SITE_OTHER): Payer: Self-pay

## 2016-01-30 ENCOUNTER — Encounter (INDEPENDENT_AMBULATORY_CARE_PROVIDER_SITE_OTHER): Payer: Self-pay | Admitting: Orthopedic Surgery

## 2016-01-30 ENCOUNTER — Ambulatory Visit (INDEPENDENT_AMBULATORY_CARE_PROVIDER_SITE_OTHER): Payer: Managed Care, Other (non HMO) | Admitting: Orthopedic Surgery

## 2016-01-30 VITALS — Ht 66.5 in | Wt 154.0 lb

## 2016-01-30 DIAGNOSIS — M2042 Other hammer toe(s) (acquired), left foot: Secondary | ICD-10-CM | POA: Diagnosis not present

## 2016-01-30 NOTE — Progress Notes (Signed)
Office Visit Note   Patient: Amber Garcia           Date of Birth: [birth year]           MRN: [id number] Visit Date: 01/30/2016              Requested by: Gareth Morgan, MD [address] [place], [place] [address] PCP: Milana Obey, MD   Assessment & Plan: Visit Diagnoses:  1. Hammer toe of left foot     Plan: Patient states that she would like to proceed with surgical intervention. We'll plan for a Weil osteotomy of the second and third metatarsals left foot PIP resection of the second toe and plantar plate repair for the second toe. Risk and benefits were discussed including infection neurovascular injury DVT persistent pain and need for additional surgery. Patient states she understands and wishes to proceed at this time.  Follow-Up Instructions: Return in about 1 week (around 02/06/2016).   Orders:  Orders Placed This Encounter  Procedures  . XR Foot 2 Views Left   No orders of the defined types were placed in this encounter.     Procedures: No procedures performed   Clinical Data: No additional findings.   Subjective: Chief Complaint  Patient presents with  . Left Foot - Pain    2nd toe clawing left foot. Ongoing problem since 2008. She has seen Dr. Fonnie Jarvis, physical therapy was recommended this provided her no relief. She feels like she is stepping on a rock or a nail.     Patient states that she saw Dr. Lestine Box several years ago and was recommended therapy and conservative care but patient states she still has persistent pain primarily beneath the second and third metatarsal heads left foot. She states she's been symptomatic since 2008. She has had no relief with conservative therapy.    Review of Systems   Objective: Vital Signs: Ht 5' 6.5" (1.689 m)   Wt 154 lb (69.9 kg)   BMI 24.48 kg/m   Physical Exam examination patient is alert oriented no adenopathy well-dressed normal affect normal respiratory effort she does have antalgic gait. She has  about 10 dorsiflexion past neutral she has good pulses. Patient has prominent second and third metatarsal heads and this is tender to palpation. She is dislocation of the MTP joint of the second and third toes and fixed clawing of the second toe with a callus over the PIP joint.  Ortho Exam  Specialty Comments:  No specialty comments available.  Imaging: Xr Foot 2 Views Left  Result Date: 01/30/2016 Two-view radiographs of the left foot shows a long second and third metatarsal with dislocation of the MTP joint of the second and third toes.    PMFS History: Patient Active Problem List   Diagnosis Date Noted  . Hammer toe of left foot 01/30/2016  . IMPINGEMENT SYNDROME 07/24/2009   Past Medical History:  Diagnosis Date  . Anxiety     Family History  Problem Relation Age of Onset  . COPD Mother   . Cancer Mother   . Cancer Maternal Grandmother   . Heart disease Maternal Grandfather     Past Surgical History:  Procedure Laterality Date  . CESAREAN SECTION     x2  . COLON SURGERY    . COLONOSCOPY N/A 06/28/2014   Procedure: COLONOSCOPY;  Surgeon: Franky Macho Md, MD;  Location: [facility];  Service: Gastroenterology;  Laterality: N/A;  . TUBAL LIGATION     Social  History   Occupational History  . LETTER [employer]   Social History Main Topics  . Smoking status: Never Smoker  . Smokeless tobacco: Never Used  . Alcohol use No  . Drug use: No  . Sexual activity: Yes    Partners: Male    Birth control/ protection: Surgical, Post-menopausal

## 2016-02-15 ENCOUNTER — Ambulatory Visit (INDEPENDENT_AMBULATORY_CARE_PROVIDER_SITE_OTHER): Payer: Managed Care, Other (non HMO) | Admitting: Orthopedic Surgery

## 2016-02-20 DIAGNOSIS — M2042 Other hammer toe(s) (acquired), left foot: Secondary | ICD-10-CM | POA: Diagnosis not present

## 2016-02-20 DIAGNOSIS — S93149A Subluxation of metatarsophalangeal joint of unspecified toe(s), initial encounter: Secondary | ICD-10-CM | POA: Diagnosis not present

## 2016-02-28 ENCOUNTER — Encounter (INDEPENDENT_AMBULATORY_CARE_PROVIDER_SITE_OTHER): Payer: Self-pay | Admitting: Orthopedic Surgery

## 2016-02-28 ENCOUNTER — Ambulatory Visit (INDEPENDENT_AMBULATORY_CARE_PROVIDER_SITE_OTHER): Payer: Self-pay | Admitting: Family

## 2016-02-28 VITALS — Ht 66.5 in | Wt 154.0 lb

## 2016-02-28 DIAGNOSIS — M2042 Other hammer toe(s) (acquired), left foot: Secondary | ICD-10-CM

## 2016-02-28 MED ORDER — HYDROCODONE-ACETAMINOPHEN 5-325 MG PO TABS: 1.0000 | ORAL_TABLET | Freq: Four times a day (QID) | ORAL | 0 refills | Status: DC | PRN

## 2016-02-28 NOTE — Progress Notes (Signed)
   Office Visit Note   Patient: Amber PlattMary H Mckinzie           Date of Birth: 05/27/1950           MRN: [id number] Visit Date: 02/28/2016              Requested by: Gareth MorganSteve Knowlton, MD [address] [place], [place] [address] PCP: Milana ObeyStephen D Knowlton, MD   Assessment & Plan: Visit Diagnoses:  1. Hammer toe of left foot     Plan: We'll pain the pin Track with triple antibiotic ointment. Cleanse the incision site with Dial soap. Continue elevating for swelling. Dressed with dry dressing. Follow up in one more week for pin removal.  Follow-Up Instructions: Return in about 1 week (around 03/06/2016).   Orders:  No orders of the defined types were placed in this encounter.  Meds ordered this encounter  Medications  . HYDROcodone-acetaminophen (NORCO/VICODIN) 5-325 MG tablet    Sig: Take 1 tablet by mouth every 6 (six) hours as needed for moderate pain.    Dispense:  60 tablet    Refill:  0      Procedures: No procedures performed   Clinical Data: No additional findings.   Subjective: Chief Complaint  Patient presents with  . Left Foot - Routine Post Op    Patient returns for first post op visit. She is status post Weil osteotomy of 2nd and 3rd metatarsals Left foot, PIP resection of the 2nd toe, and plantar plate repair for 2nd toe on 02/20/2015.  She is 8 days post op. She is doing okay. She has most pain in leg and hip but she thinks that is just from sitting. She requests refill on pain med. Sutures intact. Incisions look good.     Review of Systems  Constitutional: Negative for chills and fever.     Objective: Vital Signs: Ht 5' 6.5" (1.689 m)   Wt 154 lb (69.9 kg)   BMI 24.48 kg/m   Physical Exam Incision is well approximated with sutures. Pin is intact. This is not loose. There is no surrounding erythema no gaping no drainage no swelling no sign of infection. Ortho Exam  Specialty Comments:  No specialty comments available.  Imaging: No results found.   PMFS  History: Patient Active Problem List   Diagnosis Date Noted  . Hammer toe of left foot 01/30/2016  . IMPINGEMENT SYNDROME 07/24/2009   Past Medical History:  Diagnosis Date  . Anxiety     Family History  Problem Relation Age of Onset  . COPD Mother   . Cancer Mother   . Cancer Maternal Grandmother   . Heart disease Maternal Grandfather     Past Surgical History:  Procedure Laterality Date  . CESAREAN SECTION     x2  . COLON SURGERY    . COLONOSCOPY N/A 06/28/2014   Procedure: COLONOSCOPY;  Surgeon: Franky MachoMark Jenkins Md, MD;  Location: [facility];  Service: Gastroenterology;  Laterality: N/A;  . TUBAL LIGATION     Social History   Occupational History  . [employer]   Social History Main Topics  . Smoking status: Never Smoker  . Smokeless tobacco: Never Used  . Alcohol use No  . Drug use: No  . Sexual activity: Yes    Partners: Male    Birth control/ protection: Surgical, Post-menopausal

## 2016-03-07 ENCOUNTER — Ambulatory Visit (INDEPENDENT_AMBULATORY_CARE_PROVIDER_SITE_OTHER): Payer: Self-pay | Admitting: Orthopedic Surgery

## 2016-03-07 DIAGNOSIS — M2042 Other hammer toe(s) (acquired), left foot: Secondary | ICD-10-CM

## 2016-03-07 NOTE — Progress Notes (Signed)
   Office Visit Note   Patient: Amber Garcia           Date of Birth: 1950/04/27           MRN: [id number] Visit Date: 03/07/2016              Requested by: Gareth MorganSteve Knowlton, MD [address] [place], [place] [address] PCP: Milana ObeyStephen D Knowlton, MD  Chief Complaint  Patient presents with  . Left Foot - Routine Post Op  . Right Leg - Follow-up    HPI: The patient is a 66 year old woman who is 2 weeks status post hammer toe repair second toe left foot. Has been painting the pin track with neosporin.     Assessment & Plan: Visit Diagnoses:  1. Hammer toe of left foot     Plan: Advance to weightbearing as tolerated in her postoperative shoe. Follow-up in 2 weeks at which time we'll advance her to regular shoewear.  Follow-Up Instructions: Return in about 2 weeks (around 03/21/2016).   Ortho Exam Patient status post claw toe surgery for toes 234 and 5. Her toes are straight. The pin is removed incisions are well-healed we'll harvest the sutures.  Imaging: No results found.  Orders:  No orders of the defined types were placed in this encounter.  No orders of the defined types were placed in this encounter.    Procedures: No procedures performed  Clinical Data: No additional findings.  Subjective: Review of Systems  Objective: Vital Signs: There were no vitals taken for this visit.  Specialty Comments:  No specialty comments available.  PMFS History: Patient Active Problem List   Diagnosis Date Noted  . Hammer toe of left foot 01/30/2016  . IMPINGEMENT SYNDROME 07/24/2009   Past Medical History:  Diagnosis Date  . Anxiety     Family History  Problem Relation Age of Onset  . COPD Mother   . Cancer Mother   . Cancer Maternal Grandmother   . Heart disease Maternal Grandfather     Past Surgical History:  Procedure Laterality Date  . CESAREAN SECTION     x2  . COLON SURGERY    . COLONOSCOPY N/A 06/28/2014   Procedure: COLONOSCOPY;  Surgeon: Franky MachoMark Jenkins  Md, MD;  Location: [facility];  Service: Gastroenterology;  Laterality: N/A;  . TUBAL LIGATION     Social History   Occupational History  . [employer]   Social History Main Topics  . Smoking status: Never Smoker  . Smokeless tobacco: Never Used  . Alcohol use No  . Drug use: No  . Sexual activity: Yes    Partners: Male    Birth control/ protection: Surgical, Post-menopausal

## 2016-03-21 ENCOUNTER — Ambulatory Visit (INDEPENDENT_AMBULATORY_CARE_PROVIDER_SITE_OTHER): Payer: Self-pay | Admitting: Orthopedic Surgery

## 2016-03-21 ENCOUNTER — Encounter (INDEPENDENT_AMBULATORY_CARE_PROVIDER_SITE_OTHER): Payer: Self-pay | Admitting: Orthopedic Surgery

## 2016-03-21 VITALS — Ht 66.0 in | Wt 154.0 lb

## 2016-03-21 DIAGNOSIS — M2042 Other hammer toe(s) (acquired), left foot: Secondary | ICD-10-CM

## 2016-03-21 NOTE — Addendum Note (Signed)
Addended by: Barnie DelZAMORA, Cauy Melody R on: 03/21/2016 02:11 PM   Modules accepted: Orders

## 2016-03-21 NOTE — Progress Notes (Signed)
   Office Visit Note   Patient: Amber Garcia           Date of Birth: 05-May-1950           MRN: [id number] Visit Date: 03/21/2016              Requested by: Gareth MorganSteve Knowlton, MD [address] [place], [place] [address] PCP: Milana ObeyStephen D Knowlton, MD  Chief Complaint  Patient presents with  . Left Foot - Routine Post Op    4 weeks s/p hammer toe    HPI: Patient states that she has a lot of swelling when she is not able to elevate her leg or apply ice. She states that there is some continued tenderness. She is full weight bearing in a post op shoe. Rodena MedinAutumn L Forrest, RMA    Assessment & Plan: Visit Diagnoses:  1. Hammer toe of left foot     Plan: May advance to weight bearing as tolerated in to regular shoe wear. Follow up in office in 4 more weeks. We'll revaluate return to work at that time. Instructed on scar massage and pushing down on toes. Recommended elevation and compression stockings for swelling.   Follow-Up Instructions: Return in about 4 weeks (around 04/18/2016).   Ortho Exam Toes 2, 3, 4, and 5 are straight. There is no elevation of toes. Incisions are well healed. No erythema. No appreciable swelling today. No sensitivity over incision.  Imaging: No results found.  Orders:  No orders of the defined types were placed in this encounter.  No orders of the defined types were placed in this encounter.    Procedures: No procedures performed  Clinical Data: No additional findings.  Subjective: Review of Systems  Objective: Vital Signs: Ht 5\' 6"  (1.676 m)   Wt 154 lb (69.9 kg)   BMI 24.86 kg/m   Specialty Comments:  No specialty comments available.  PMFS History: Patient Active Problem List   Diagnosis Date Noted  . Hammer toe of left foot 01/30/2016  . IMPINGEMENT SYNDROME 07/24/2009   Past Medical History:  Diagnosis Date  . Anxiety     Family History  Problem Relation Age of Onset  . COPD Mother   . Cancer Mother   . Cancer Maternal  Grandmother   . Heart disease Maternal Grandfather     Past Surgical History:  Procedure Laterality Date  . CESAREAN SECTION     x2  . COLON SURGERY    . COLONOSCOPY N/A 06/28/2014   Procedure: COLONOSCOPY;  Surgeon: Franky MachoMark Jenkins Md, MD;  [facility];  Service: Gastroenterology;  Laterality: N/A;  . TUBAL LIGATION     Social History   Occupational History  . LETTER [employer]   Social History Main Topics  . Smoking status: Never Smoker  . Smokeless tobacco: Never Used  . Alcohol use No  . Drug use: No  . Sexual activity: Yes    Partners: Male    Birth control/ protection: Surgical, Post-menopausal

## 2016-03-29 ENCOUNTER — Telehealth (INDEPENDENT_AMBULATORY_CARE_PROVIDER_SITE_OTHER): Payer: Self-pay | Admitting: Orthopedic Surgery

## 2016-03-29 ENCOUNTER — Encounter (INDEPENDENT_AMBULATORY_CARE_PROVIDER_SITE_OTHER): Payer: Self-pay | Admitting: Orthopedic Surgery

## 2016-03-29 NOTE — Telephone Encounter (Signed)
Patient called advised she need a doctors note stating she will be out of work until 04/15/16. Patient said it have to be written on [employer]'s letter head. Patient asked if her FMLA case number be added on the note  (case# [id number])  Patient asked that the note be mailed to her home. The number to contact patient is [phone]

## 2016-04-01 ENCOUNTER — Other Ambulatory Visit (INDEPENDENT_AMBULATORY_CARE_PROVIDER_SITE_OTHER): Payer: Self-pay

## 2016-04-01 NOTE — Telephone Encounter (Signed)
Autumn has already addressed this for the patient.

## 2016-04-15 ENCOUNTER — Ambulatory Visit (INDEPENDENT_AMBULATORY_CARE_PROVIDER_SITE_OTHER): Payer: Self-pay | Admitting: Orthopedic Surgery

## 2016-04-15 DIAGNOSIS — M2042 Other hammer toe(s) (acquired), left foot: Secondary | ICD-10-CM

## 2016-04-15 NOTE — Progress Notes (Signed)
   Office Visit Note   Patient: Trena PlattMary H Nicklin           Date of Birth: 1950/11/21           MRN: [id number] Visit Date: 04/15/2016              Requested by: Gareth MorganSteve Knowlton, MD [address] [place], [place] [address] PCP: Milana ObeyStephen D Knowlton, MD  Chief Complaint  Patient presents with  . Left Foot - Follow-up    HPI: The patient is a 66 year old woman who is status post hammer toe repair second toe left foot. She is over 7.5 weeks post op. She is doing well overall. She complains of swelling. She is feeling better overall. She has been out of work and will need an updated work status pending today's office visit. Donalee CitrinStepheney L Peele, RT    Assessment & Plan: Visit Diagnoses:  1. Hammer toe of left foot     Plan: Patient still has some swelling recommended she continue with her socks continue with her sneakers she is given a note to return to work without restrictions.  Follow-Up Instructions: Return if symptoms worsen or fail to improve.   Ortho Exam On examination there is some swelling incision is well-healed there is no redness no cellulitis she has a normal cascade of her toes there is no clawing. ROS: Complete review of systems negative. Imaging: No results found.  Labs: Lab Results  Component Value Date   LABORGA ESCHERICHIA COLI 02/12/2015    Orders:  No orders of the defined types were placed in this encounter.  No orders of the defined types were placed in this encounter.    Procedures: No procedures performed  Clinical Data: No additional findings.  Subjective: Review of Systems  Objective: Vital Signs: There were no vitals taken for this visit.  Specialty Comments:  No specialty comments available.  PMFS History: Patient Active Problem List   Diagnosis Date Noted  . Hammer toe of left foot 01/30/2016  . IMPINGEMENT SYNDROME 07/24/2009   Past Medical History:  Diagnosis Date  . Anxiety     Family History  Problem Relation Age of Onset    . COPD Mother   . Cancer Mother   . Cancer Maternal Grandmother   . Heart disease Maternal Grandfather     Past Surgical History:  Procedure Laterality Date  . CESAREAN SECTION     x2  . COLON SURGERY    . COLONOSCOPY N/A 06/28/2014   Procedure: COLONOSCOPY;  Surgeon: Franky MachoMark Jenkins Md, MD;  [facility]: [facility];  Service: Gastroenterology;  Laterality: N/A;  . TUBAL LIGATION     Social History   Occupational History  . [employer]   Social History Main Topics  . Smoking status: Never Smoker  . Smokeless tobacco: Never Used  . Alcohol use No  . Drug use: No  . Sexual activity: Yes    Partners: Male    Birth control/ protection: Surgical, Post-menopausal

## 2016-11-14 ENCOUNTER — Emergency Department (HOSPITAL_COMMUNITY): Payer: 59

## 2016-11-14 ENCOUNTER — Observation Stay (HOSPITAL_COMMUNITY)
Admission: EM | Admit: 2016-11-14 | Discharge: 2016-11-17 | Disposition: A | Discharge status: Home / Self Care | Payer: 59 | Attending: Family Medicine | Admitting: Family Medicine

## 2016-11-14 ENCOUNTER — Encounter (HOSPITAL_COMMUNITY): Payer: Self-pay | Admitting: Emergency Medicine

## 2016-11-14 DIAGNOSIS — R509 Fever, unspecified: Secondary | ICD-10-CM | POA: Diagnosis not present

## 2016-11-14 DIAGNOSIS — N390 Urinary tract infection, site not specified: Secondary | ICD-10-CM | POA: Diagnosis present

## 2016-11-14 DIAGNOSIS — R5383 Other fatigue: Secondary | ICD-10-CM | POA: Insufficient documentation

## 2016-11-14 DIAGNOSIS — R531 Weakness: Secondary | ICD-10-CM | POA: Diagnosis present

## 2016-11-14 DIAGNOSIS — Z79899 Other long term (current) drug therapy: Secondary | ICD-10-CM | POA: Diagnosis not present

## 2016-11-14 LAB — CBC WITH DIFFERENTIAL/PLATELET
Basophils Absolute: 0 10*3/uL (ref 0.0–0.1)
Basophils Relative: 0 %
Eosinophils Absolute: 0 10*3/uL (ref 0.0–0.7)
Eosinophils Relative: 0 %
HCT: 39.1 % (ref 36.0–46.0)
Hemoglobin: 13.3 g/dL (ref 12.0–15.0)
Lymphocytes Relative: 9 %
Lymphs Abs: 0.9 10*3/uL (ref 0.7–4.0)
MCH: 30.1 pg (ref 26.0–34.0)
MCHC: 34 g/dL (ref 30.0–36.0)
MCV: 88.5 fL (ref 78.0–100.0)
Monocytes Absolute: 0.3 10*3/uL (ref 0.1–1.0)
Monocytes Relative: 3 %
Neutro Abs: 8.9 10*3/uL — ABNORMAL HIGH (ref 1.7–7.7)
Neutrophils Relative %: 88 %
Platelets: 137 10*3/uL — ABNORMAL LOW (ref 150–400)
RBC: 4.42 MIL/uL (ref 3.87–5.11)
RDW: 13.3 % (ref 11.5–15.5)
WBC: 10.1 10*3/uL (ref 4.0–10.5)

## 2016-11-14 LAB — URINALYSIS, COMPLETE (UACMP) WITH MICROSCOPIC
Bilirubin Urine: NEGATIVE
Glucose, UA: NEGATIVE mg/dL
Hgb urine dipstick: NEGATIVE
Ketones, ur: NEGATIVE mg/dL
Nitrite: NEGATIVE
Protein, ur: NEGATIVE mg/dL
Specific Gravity, Urine: 1.009 (ref 1.005–1.030)
Squamous Epithelial / LPF: NONE SEEN
pH: 6 (ref 5.0–8.0)

## 2016-11-14 LAB — COMPREHENSIVE METABOLIC PANEL
ALBUMIN: 4.3 g/dL (ref 3.5–5.0)
ALT: 18 U/L (ref 14–54)
ANION GAP: 10 (ref 5–15)
AST: 25 U/L (ref 15–41)
Alkaline Phosphatase: 58 U/L (ref 38–126)
BUN: 16 mg/dL (ref 6–20)
CHLORIDE: 105 mmol/L (ref 101–111)
CO2: 25 mmol/L (ref 22–32)
Calcium: 9.1 mg/dL (ref 8.9–10.3)
Creatinine, Ser: 0.79 mg/dL (ref 0.44–1.00)
GFR calc Af Amer: >60 mL/min (ref >60)
GFR calc non Af Amer: >60 mL/min (ref >60)
Glucose, Bld: 100 mg/dL — ABNORMAL HIGH (ref 65–99)
POTASSIUM: 3.6 mmol/L (ref 3.5–5.1)
Sodium: 140 mmol/L (ref 135–145)
Total Bilirubin: 0.8 mg/dL (ref 0.3–1.2)
Total Protein: 7.1 g/dL (ref 6.5–8.1)

## 2016-11-14 MED ORDER — DEXTROSE 5 % IV SOLN
1.0000 g | Freq: Once | INTRAVENOUS | Status: AC
  Administered 2016-11-14: 1 g via INTRAVENOUS
  Filled 2016-11-14: qty 10

## 2016-11-14 MED ORDER — IBUPROFEN 800 MG PO TABS
800.0000 mg | ORAL_TABLET | Freq: Once | ORAL | Status: AC
  Administered 2016-11-14: 800 mg via ORAL
  Filled 2016-11-14: qty 1

## 2016-11-14 MED ORDER — SODIUM CHLORIDE 0.9 % IV BOLUS (SEPSIS)
1000.0000 mL | Freq: Once | INTRAVENOUS | Status: AC
  Administered 2016-11-14: 1000 mL via INTRAVENOUS

## 2016-11-14 MED ORDER — ACETAMINOPHEN 500 MG PO TABS
1000.0000 mg | ORAL_TABLET | Freq: Once | ORAL | Status: AC
  Administered 2016-11-14: 1000 mg via ORAL
  Filled 2016-11-14: qty 2

## 2016-11-14 MED ORDER — SODIUM CHLORIDE 0.9 % IV SOLN
INTRAVENOUS | Status: DC
  Administered 2016-11-15 – 2016-11-16 (×4): via INTRAVENOUS

## 2016-11-14 NOTE — ED Provider Notes (Signed)
Emergency Department Provider Note   I have reviewed the triage vital signs and the nursing notes.   HISTORY  Chief Complaint Fatigue   HPI Amber Garcia is a 66 y.o. female without synechia past medical history the presents with an acute onset of weakness. Patient states that she was in her truck and she had nausea for a short period time and then had a blood of weakness and chills and feeling she did not pass out. She was taken MAB low blood sugar however she's never had that before, and ate a piece of candy and continued to feel bad. She went to a pharmacy where the chills just got worse. At time my evaluation patient is having chills still and overall weakness. No other symptoms. Review of systems a son that she did have some dysuria week or 2 ago but that resolved after taking some AZO. States she has a history of anxiety but does not feel this is similar. She has lost 40 pounds in the last year but is been intentional she is on a diet. She had not any chest pain, abdominal pain, back pain, extremity pain but does complain of a mild headache. Has never had it as before. Has not done anything else to help with the symptoms. Does state that she was recently treated with confirmed [facility] spotted fever finish and doxycycline (3 weeks' worth) a couple weeks ago. Patient also found to have a fungal infection of her skin for which she is on a medication of which she is unsure.   Past Medical History:  Diagnosis Date  . Anxiety     Patient Active Problem List   Diagnosis Date Noted  . Hammer toe of left foot 01/30/2016  . IMPINGEMENT SYNDROME 07/24/2009    Past Surgical History:  Procedure Laterality Date  . CESAREAN SECTION     x2  . COLON SURGERY    . COLONOSCOPY N/A 06/28/2014   Procedure: COLONOSCOPY;  Surgeon: Franky Macho Md, MD;  Location: [facility];  Service: Gastroenterology;  Laterality: N/A;  . TUBAL LIGATION      Current Outpatient Rx  . Order #:  [id number] Class: Historical Med  . Order #: [id number] Class: Historical Med  . Order #: [id number] Class: Historical Med  . Order #: [id number] Class: Historical Med  . Order #: [id number] Class: Historical Med  . Order #: [id number] Class: Historical Med  . Order #: [id number] Class: Historical Med  . Order #: [id number] Class: Historical Med  . Order #: [id number] Class: Historical Med  . Order #: [id number] Class: Historical Med  . Order #: [id number] Class: Historical Med  . Order #: [id number] Class: Historical Med  . Order #: [id number] Class: Historical Med  . Order #: [id number] Class: Historical Med  . Order #: [id number] Class: Historical Med  . Order #: [id number] Class: Historical Med    Allergies Influenza vaccines  Family History  Problem Relation Age of Onset  . COPD Mother   . Cancer Mother   . Cancer Maternal Grandmother   . Heart disease Maternal Grandfather     Social History Social History  Substance Use Topics  . Smoking status: Never Smoker  . Smokeless tobacco: Never Used  . Alcohol use No    Review of Systems  All other systems negative except as documented in the HPI. All pertinent positives and negatives as reviewed in the HPI. ____________________________________________   PHYSICAL EXAM:  VITAL SIGNS: ED Triage Vitals  Enc Vitals Group     BP 11/14/16 1715 119/61  Pulse Rate 11/14/16 1715 98     Resp 11/14/16 1715 18     Temp 11/14/16 1715 99.9 F (37.7 C)     Temp Source 11/14/16 1715 Oral     SpO2 11/14/16 1715 95 %     Weight 11/14/16 1715 161 lb (73 kg)     Height 11/14/16 1715 5' 6.5" (1.689 m)     Head Circumference --      Peak Flow --      Pain Score 11/14/16 1708 3     Pain Loc --      Pain Edu? --      Excl. in GC? --     Constitutional: Alert and oriented. Well appearing and in no acute distress. Eyes: Conjunctivae are normal. PERRL. EOMI. Head: Atraumatic. Nose: No congestion/rhinnorhea. Mouth/Throat: Mucous membranes are moist.  Oropharynx  non-erythematous. Neck: No stridor.  No meningeal signs.   Cardiovascular: Normal rate, regular rhythm. Good peripheral circulation. Grossly normal heart sounds.   Respiratory: Normal respiratory effort.  No retractions. Lungs CTAB. Gastrointestinal: Soft and nontender. No distention.  Musculoskeletal: No lower extremity tenderness nor edema. No gross deformities of extremities. Neurologic:  Normal speech and language. No gross focal neurologic deficits are appreciated.  Skin:  Skin is warm, dry and intact. No rash noted.   ____________________________________________   LABS (all labs ordered are listed, but only abnormal results are displayed)  Labs Reviewed  COMPREHENSIVE METABOLIC PANEL - Abnormal; Notable for the following:       Result Value   Glucose, Bld 100 (*)    All other components within normal limits  CBC WITH DIFFERENTIAL/PLATELET - Abnormal; Notable for the following:    Platelets 137 (*)    Neutro Abs 8.9 (*)    All other components within normal limits  URINALYSIS, COMPLETE (UACMP) WITH MICROSCOPIC - Abnormal; Notable for the following:    APPearance HAZY (*)    Leukocytes, UA MODERATE (*)    Bacteria, UA RARE (*)    All other components within normal limits  CULTURE, BLOOD (ROUTINE X 2)  CULTURE, BLOOD (ROUTINE X 2)  URINE CULTURE  FUNGUS CULTURE, BLOOD  ROCKY MTN SPOTTED FVR ABS PNL(IGG+IGM)  CBG MONITORING, ED   ____________________________________________  EKG   EKG Interpretation  Date/Time:  Thursday November 14 2016 18:18:15 EDT Ventricular Rate:  89 PR Interval:    QRS Duration: 87 QT Interval:  344 QTC Calculation: 419 R Axis:   65 Text Interpretation:  Sinus rhythm Low voltage, precordial leads Borderline T abnormalities, anterior leads No old tracing to compare Confirmed by Amber Memos [phone]) on 11/14/2016 6:24:35 PM       ____________________________________________  RADIOLOGY  Dg Chest 2 View  Result Date:  11/14/2016 CLINICAL DATA:  Weakness EXAM: CHEST  2 VIEW COMPARISON:  02/12/2015 FINDINGS: Mild streaky bibasilar atelectasis. No focal consolidation or effusion. Normal cardiomediastinal silhouette. No pneumothorax. IMPRESSION: Minimal streaky bibasilar atelectasis. Electronically Signed   By: Jasmine Pang M.D.   On: 11/14/2016 19:32    ____________________________________________   PROCEDURES  Procedure(s) performed:   Procedures   ____________________________________________   INITIAL IMPRESSION / ASSESSMENT AND PLAN / ED COURSE  Pertinent labs & imaging results that were available during my care of the patient were reviewed by me and considered in my medical decision making (see chart for details).  Suspect infection of some sort vs sepsis, will eval appropriately. Less suspicion of neurologic cause or cardiac causes but will get an EKG and UA.Wonder she sepsis  from a urinary tract infection and was not treated. We'll get a rectal temperature, check labs to include urine EKG was chest x-ray. We'll need a more comprehensive skin exam when she's in a private room.  No obvious rashes. Found to have recal temp of 101.7. Start Rocephin for the likely urinary tract infection however she does have this recent RMSF and fungal infection. We'll attempt deffervescence as well.  Concerned patient may have bacteremia versus some type of complication from RMSF for fungal infection. Patient still with a slight headache and fever. So will discuss with medicine about admission for observation to ensure that she improves.   ____________________________________________  FINAL CLINICAL IMPRESSION(S) / ED DIAGNOSES  Final diagnoses:  Weakness  Other fatigue  Fever, unspecified fever cause     MEDICATIONS GIVEN DURING THIS VISIT:  Medications  ibuprofen (ADVIL,MOTRIN) tablet 800 mg (not administered)  sodium chloride 0.9 % bolus 1,000 mL (1,000 mLs Intravenous New Bag/Given 11/14/16 1836)   acetaminophen (TYLENOL) tablet 1,000 mg (1,000 mg Oral Given 11/14/16 1845)  cefTRIAXone (ROCEPHIN) 1 g in dextrose 5 % 50 mL IVPB (1 g Intravenous New Bag/Given 11/14/16 1932)     NEW OUTPATIENT MEDICATIONS STARTED DURING THIS VISIT:  New Prescriptions   No medications on file    Note:  This document was prepared using Dragon voice recognition software and may include unintentional dictation errors.   Amber Garcia, Amber Failla, MD 11/14/16 2236

## 2016-11-14 NOTE — ED Triage Notes (Signed)
Pt brought in EMS, pt reports walked into [facility] and reports sudden onset chills and "something not right." pt alert at this time but EMS reports pt was drowsy at time of arrival. Initial cbg 72. EMS re-assessed and reported cbg 103 at time of arrival to ED. nad noted. Equal grips, facial symmetry, no drift.

## 2016-11-15 DIAGNOSIS — R531 Weakness: Secondary | ICD-10-CM | POA: Diagnosis not present

## 2016-11-15 DIAGNOSIS — R509 Fever, unspecified: Secondary | ICD-10-CM | POA: Diagnosis not present

## 2016-11-15 DIAGNOSIS — N390 Urinary tract infection, site not specified: Secondary | ICD-10-CM | POA: Diagnosis present

## 2016-11-15 DIAGNOSIS — N3000 Acute cystitis without hematuria: Secondary | ICD-10-CM | POA: Diagnosis not present

## 2016-11-15 LAB — COMPREHENSIVE METABOLIC PANEL
ALK PHOS: 44 U/L (ref 38–126)
ALT: 15 U/L (ref 14–54)
AST: 18 U/L (ref 15–41)
Albumin: 3.3 g/dL — ABNORMAL LOW (ref 3.5–5.0)
Anion gap: 6 (ref 5–15)
BUN: 15 mg/dL (ref 6–20)
CALCIUM: 8.1 mg/dL — AB (ref 8.9–10.3)
CO2: 24 mmol/L (ref 22–32)
CREATININE: 0.69 mg/dL (ref 0.44–1.00)
Chloride: 111 mmol/L (ref 101–111)
GFR calc non Af Amer: >60 mL/min (ref >60)
GLUCOSE: 101 mg/dL — AB (ref 65–99)
Potassium: 3.6 mmol/L (ref 3.5–5.1)
SODIUM: 141 mmol/L (ref 135–145)
Total Bilirubin: 0.8 mg/dL (ref 0.3–1.2)
Total Protein: 5.7 g/dL — ABNORMAL LOW (ref 6.5–8.1)

## 2016-11-15 LAB — CBC
HEMATOCRIT: 35.7 % — AB (ref 36.0–46.0)
HEMOGLOBIN: 11.8 g/dL — AB (ref 12.0–15.0)
MCH: 29.8 pg (ref 26.0–34.0)
MCHC: 33.1 g/dL (ref 30.0–36.0)
MCV: 90.2 fL (ref 78.0–100.0)
Platelets: 132 10*3/uL — ABNORMAL LOW (ref 150–400)
RBC: 3.96 MIL/uL (ref 3.87–5.11)
RDW: 13.3 % (ref 11.5–15.5)
WBC: 8 10*3/uL (ref 4.0–10.5)

## 2016-11-15 MED ORDER — ONDANSETRON HCL 4 MG PO TABS: 4.0000 mg | ORAL_TABLET | Freq: Four times a day (QID) | ORAL | Status: DC | PRN

## 2016-11-15 MED ORDER — LORAZEPAM 0.5 MG PO TABS
0.5000 mg | ORAL_TABLET | Freq: Three times a day (TID) | ORAL | Status: DC
  Administered 2016-11-15 – 2016-11-17 (×6): 0.5 mg via ORAL
  Filled 2016-11-15 (×7): qty 1

## 2016-11-15 MED ORDER — ACETAMINOPHEN 325 MG PO TABS
650.0000 mg | ORAL_TABLET | Freq: Four times a day (QID) | ORAL | Status: DC | PRN
  Administered 2016-11-16 (×2): 650 mg via ORAL
  Filled 2016-11-15 (×2): qty 2

## 2016-11-15 MED ORDER — VITAMIN E 180 MG (400 UNIT) PO CAPS
400.0000 [IU] | ORAL_CAPSULE | Freq: Every day | ORAL | Status: DC
  Administered 2016-11-15 – 2016-11-17 (×3): 400 [IU] via ORAL
  Filled 2016-11-15 (×5): qty 1

## 2016-11-15 MED ORDER — ONDANSETRON HCL 4 MG/2ML IJ SOLN: 4.0000 mg | Freq: Four times a day (QID) | INTRAMUSCULAR | Status: DC | PRN

## 2016-11-15 MED ORDER — DEXTROSE 5 % IV SOLN
1.0000 g | INTRAVENOUS | Status: DC
  Administered 2016-11-16 (×2): 1 g via INTRAVENOUS
  Filled 2016-11-15 (×4): qty 10

## 2016-11-15 MED ORDER — ENOXAPARIN SODIUM 40 MG/0.4ML ~~LOC~~ SOLN
40.0000 mg | SUBCUTANEOUS | Status: DC
  Administered 2016-11-16 – 2016-11-17 (×2): 40 mg via SUBCUTANEOUS
  Filled 2016-11-15 (×2): qty 0.4

## 2016-11-15 MED ORDER — ACETAMINOPHEN 325 MG PO TABS
650.0000 mg | ORAL_TABLET | Freq: Four times a day (QID) | ORAL | Status: DC | PRN
  Administered 2016-11-15: 650 mg via ORAL
  Filled 2016-11-15: qty 2

## 2016-11-15 MED ORDER — OMEGA-3-ACID ETHYL ESTERS 1 G PO CAPS
1.0000 g | ORAL_CAPSULE | Freq: Every day | ORAL | Status: DC
  Administered 2016-11-15 – 2016-11-17 (×3): 1 g via ORAL
  Filled 2016-11-15 (×6): qty 1

## 2016-11-15 MED ORDER — VITAMIN B-12 1000 MCG PO TABS
1000.0000 ug | ORAL_TABLET | Freq: Every day | ORAL | Status: DC
Start: 2016-11-15 — End: 2016-11-17
  Administered 2016-11-15 – 2016-11-17 (×3): 1000 ug via ORAL
  Filled 2016-11-15 (×6): qty 1

## 2016-11-15 MED ORDER — NIACIN 500 MG PO TABS
500.0000 mg | ORAL_TABLET | Freq: Every day | ORAL | Status: DC
  Administered 2016-11-15: 500 mg via ORAL
  Filled 2016-11-15 (×4): qty 1

## 2016-11-15 MED ORDER — ASPIRIN EC 81 MG PO TBEC
81.0000 mg | DELAYED_RELEASE_TABLET | Freq: Every day | ORAL | Status: DC
  Administered 2016-11-15 – 2016-11-17 (×3): 81 mg via ORAL
  Filled 2016-11-15 (×3): qty 1

## 2016-11-15 NOTE — ED Notes (Signed)
Pt ambulatory to bathroom in no distress.  Denies any needs.

## 2016-11-15 NOTE — H&P (Addendum)
TRH H&P    Patient Demographics:    Amber Garcia, is a 66 y.o. female  MRN: [id number]  DOB - [birth year]  Admit Date - 11/14/2016  Referring MD/NP/PA: Dr. Erin Hearing  Outpatient Primary MD for the patient is Gareth Morgan, MD  Patient coming from: home  Chief Complaint  Patient presents with  . Fatigue      HPI:    Amber Garcia  is a 66 y.o. female, with history of anxiety disorder came to hospital with sudden onset of shaking chills which started on 4 PM. Patient says that the chills and shaking lasted for about hour and a half, and improved after patient came to ED. She was recently treated for confirmed [facility] spotted fever with doxycycline for 3 weeks. She stopped taking doxycycline 2 weeks ago.  Patient says that she had no symptoms after she stopped taking doxycycline. She complains of dysuria week ago, which resolved after she took Azo for 2 days. At this time she denies any dysuria, urgency or frequency of urination. She denies cough, no chest pain or shortness of breath. No coughing up any phlegm. Denies nausea vomiting or diarrhea.  In the ED, patient was found to have abnormal UA, started on ceftriaxone for UTI.    Review of systems:      All other systems reviewed and are negative.   With Past History of the following :    Past Medical History:  Diagnosis Date  . Anxiety       Past Surgical History:  Procedure Laterality Date  . CESAREAN SECTION     x2  . COLON SURGERY    . COLONOSCOPY N/A 06/28/2014   Procedure: COLONOSCOPY;  Surgeon: Franky Macho Md, MD;  Location: [facility];  Service: Gastroenterology;  Laterality: N/A;  . TUBAL LIGATION        Social History:      Social History  Substance Use Topics  . Smoking status: Never Smoker  . Smokeless tobacco: Never Used  . Alcohol use No       Family History :     Family History  Problem Relation Age of  Onset  . COPD Mother   . Cancer Mother   . Cancer Maternal Grandmother   . Heart disease Maternal Grandfather       Home Medications:   Prior to Admission medications   Medication Sig Start Date End Date Taking? Authorizing Provider  Ascorbic Acid (VITAMIN C) 1000 MG tablet Take 3,000 mg by mouth daily.   Yes [provider]  aspirin EC 81 MG tablet Take 81 mg by mouth daily.   Yes [provider]  B Complex Vitamins (VITAMIN B COMPLEX PO) Take 1 tablet by mouth daily.   Yes [provider]  Cholecalciferol (VITAMIN D-3 PO) Take 1 tablet by mouth daily.   Yes [provider]  Coenzyme Q10 (COQ-10) 200 MG CAPS Take 1 capsule by mouth daily.   Yes [provider]  CRANBERRY PO Take 1 tablet by mouth daily.   Yes  [provider]  Garlic 1000 MG CAPS Take 1 capsule by mouth daily.   Yes [provider]  LORazepam (ATIVAN) 0.5 MG tablet Take 0.5 mg by mouth 3 (three) times daily.    Yes [provider]  Multiple Vitamin (MULTIVITAMIN WITH MINERALS) TABS tablet Take 1 tablet by mouth daily.   Yes [provider]  naproxen (NAPROSYN) 250 MG tablet Take 500 mg by mouth daily.   Yes [provider]  niacin 500 MG tablet Take 500 mg by mouth daily.   Yes [provider]  Omega-3 Fatty Acids (FISH OIL PO) Take 1 tablet by mouth daily.    Yes [provider]  Polyethyl Glycol-Propyl Glycol (SYSTANE OP) Apply 1-2 drops to eye daily as needed (irritation).   Yes [provider]  Potassium 99 MG TABS Take 1 tablet by mouth daily.   Yes [provider]  vitamin B-12 (CYANOCOBALAMIN) 1000 MCG tablet Take 1,000 mcg by mouth daily.   Yes [provider]  vitamin E 400 UNIT capsule Take 400 Units by mouth daily.   Yes [provider]     Allergies:     Allergies  Allergen Reactions  . Influenza Vaccines     Severe local reaction     Physical Exam:    Vitals  Blood pressure (!) 87/66, pulse 74, temperature 98.3 F (36.8 C), temperature source Oral, resp. rate 16, height 5' 6.5" (1.689 m), weight 73 kg (161 lb), SpO2 95 %.  1.  General: Appears in no acute distress  2. Psychiatric:  Intact judgement and  insight, awake alert, oriented x 3.  3. Neurologic: No focal neurological deficits, all cranial nerves intact.Strength 5/5 all 4 extremities, sensation intact all 4 extremities, plantars down going.  4. Eyes :  anicteric sclerae, moist conjunctivae with no lid lag. PERRLA.  5. ENMT:  Oropharynx clear with moist mucous membranes and good dentition  6. Neck:  supple, no cervical lymphadenopathy appriciated, No thyromegaly  7. Respiratory : Normal respiratory effort, good air movement bilaterally,clear to  auscultation bilaterally  8. Cardiovascular : RRR, no gallops, rubs or murmurs, no leg edema  9. Gastrointestinal:  Positive bowel sounds, abdomen soft, non-tender to palpation,no hepatosplenomegaly, no rigidity or guarding       10. Skin:  No cyanosis, normal texture and turgor, no rash, lesions or ulcers  11.Musculoskeletal:  Good muscle tone,  joints appear normal , no effusions,  normal range of motion    Data Review:    CBC  Recent Labs Lab 11/14/16 1827  WBC 10.1  HGB 13.3  HCT 39.1  PLT 137*  MCV 88.5  MCH 30.1  MCHC 34.0  RDW 13.3  LYMPHSABS 0.9  MONOABS 0.3  EOSABS 0.0  BASOSABS 0.0   ------------------------------------------------------------------------------------------------------------------  Chemistries   Recent Labs Lab 11/14/16 1827  NA 140  K 3.6  CL 105  CO2 25  GLUCOSE 100*  BUN 16  CREATININE 0.79  CALCIUM 9.1  AST 25  ALT 18  ALKPHOS 58  BILITOT 0.8    ------------------------------------------------------------------------------------------------------------------  ------------------------------------------------------------------------------------------------------------------ GFR: Estimated Creatinine Clearance: 71.5 mL/min (by C-G formula based on SCr of 0.79 mg/dL). Liver Function Tests:  Recent Labs Lab 11/14/16 1827  AST 25  ALT 18  ALKPHOS 58  BILITOT 0.8  PROT 7.1  ALBUMIN 4.3   No results for input(s): LIPASE, AMYLASE in the last 168 hours. No results for input(s): AMMONIA in the last 168 hours. Coagulation Profile: No results for input(s):  INR, PROTIME in the last 168 hours. Cardiac Enzymes: No results for input(s): CKTOTAL, CKMB, CKMBINDEX, TROPONINI in the last 168 hours. BNP (last 3 results) No results for input(s): PROBNP in the last 8760 hours. HbA1C: No results for input(s): HGBA1C in the last 72 hours. CBG: No results for input(s): GLUCAP in the last 168 hours. Lipid Profile: No results for input(s): CHOL, HDL, LDLCALC, TRIG, CHOLHDL, LDLDIRECT in the last 72 hours. Thyroid Function Tests: No results for input(s): TSH, T4TOTAL, FREET4, T3FREE, THYROIDAB in the last 72 hours. Anemia Panel: No results for input(s): VITAMINB12, FOLATE, FERRITIN, TIBC, IRON, RETICCTPCT in the last 72 hours.  --------------------------------------------------------------------------------------------------------------- Urine analysis:    Component Value Date/Time   COLORURINE YELLOW 11/14/2016 1726   APPEARANCEUR HAZY (A) 11/14/2016 1726   LABSPEC 1.009 11/14/2016 1726   PHURINE 6.0 11/14/2016 1726   GLUCOSEU NEGATIVE 11/14/2016 1726   HGBUR NEGATIVE 11/14/2016 1726   BILIRUBINUR NEGATIVE 11/14/2016 1726   BILIRUBINUR small (A) 02/13/2015 1331   KETONESUR NEGATIVE 11/14/2016 1726   PROTEINUR NEGATIVE 11/14/2016 1726   UROBILINOGEN 0.2 02/13/2015 1331   NITRITE NEGATIVE 11/14/2016 1726   LEUKOCYTESUR MODERATE (A)  11/14/2016 1726      Imaging Results:    Dg Chest 2 View  Result Date: 11/14/2016 CLINICAL DATA:  Weakness EXAM: CHEST  2 VIEW COMPARISON:  02/12/2015 FINDINGS: Mild streaky bibasilar atelectasis. No focal consolidation or effusion. Normal cardiomediastinal silhouette. No pneumothorax. IMPRESSION: Minimal streaky bibasilar atelectasis. Electronically Signed   By: Jasmine PangKim  Fujinaga M.D.   On: 11/14/2016 19:32    My personal review of EKG: Rhythm NSR, borderline T-wave abnormalities in anterior leads   Assessment & Plan:    Active Problems:   UTI (urinary tract infection)   1. UTI       Start ceftriaxone per pharmacy consultation        Follow urine culture results  2     Anxiety        Continue Ativan 0.5 mg by mouth 3 times a day  3      Fever         Likely from UTI         Patient was recently treated for RMSF         Also had superficial fungal infection, treated with Lamisil.          Follow blood culture results., Fungal cultures have also been obtained.     DVT Prophylaxis-   Lovenox   AM Labs Ordered, also please review Full Orders  Family Communication: Admission, patients condition and plan of care including tests being ordered have been discussed with the patient and her husband at bedside who indicate understanding and agree with the plan and Code Status.  Code Status:  Full code  Admission status: observation    Time spent in minutes : 60 minutes   Selena Swaminathan S M.D on 11/15/2016 at 12:06 AM  Between 7am to 7pm - Pager - [phone]. After 7pm go to [email or web address] - password [email or web address]  [facility] - Office  [phone]

## 2016-11-15 NOTE — ED Notes (Signed)
Pt moved to hospital bed.  Denies any needs.

## 2016-11-15 NOTE — Progress Notes (Signed)
Pharmacy Antibiotic Note  Trena PlattMary H Osentoski is a 66 y.o. female admitted on 11/14/2016 with UTI.  Pharmacy has been consulted for ROCEPHIN dosing.  Plan: Rocephin 1gm IV q24hrs Monitor labs, progress, c/s  Height: 5' 6.5" (168.9 cm) Weight: 169 lb 6.4 oz (76.8 kg) IBW/kg (Calculated) : 60.45  Temp (24hrs), Avg:99.1 F (37.3 C), Min:97.6 F (36.4 C), Max:101.7 F (38.7 C)   Recent Labs Lab 11/14/16 1827 11/15/16 0509  WBC 10.1 8.0  CREATININE 0.79 0.69    Estimated Creatinine Clearance: 73.2 mL/min (by C-G formula based on SCr of 0.69 mg/dL).    Allergies  Allergen Reactions  . Influenza Vaccines     Severe local reaction   Antimicrobials this admission: Rocephin 10/18 >>   Dose adjustments this admission:  Microbiology results:  BCx: pending  Fungus Cx: pending  Thank you for allowing pharmacy to be a part of this patient's care.  Valrie HartHall, Hunter Bachar A 11/15/2016 12:35 PM

## 2016-11-15 NOTE — Progress Notes (Signed)
PROGRESS NOTE    Amber Garcia  ZOX:[id number] DOB: 03-17-1950 DOA: 11/14/2016 PCP: Gareth Morgan, MD    Brief Narrative:  Amber Garcia  is a 67 y.o. female, with history of anxiety disorder came to hospital with sudden onset of shaking chills which started on 4 PM. Patient says that the chills and shaking lasted for about hour and a half, and improved after patient came to ED. She was recently treated for confirmed [facility] spotted fever with doxycycline for 3 weeks. She stopped taking doxycycline 2 weeks ago.  Patient says that she had no symptoms after she stopped taking doxycycline. She complains of dysuria week ago, which resolved after she took Azo for 2 days. At this time she denies any dysuria, urgency or frequency of urination. She denies cough, no chest pain or shortness of breath. No coughing up any phlegm. Denies nausea vomiting or diarrhea.  In the ED, patient was found to have abnormal UA, started on ceftriaxone for UTI.   Assessment & Plan:   Active Problems:   UTI (urinary tract infection)  UTI       Start ceftriaxone per pharmacy consultation        Follow urine culture results       If culture negative will stop rocephin  Anxiety        Continue Ativan 0.5 mg by mouth 3 times a day  Fever         Likely from UTI         Patient was recently treated for RMSF         Also had superficial fungal infection, treated with Lamisil.         Urine showing budding yeasts         Follow blood culture results         Fungal cultures have also been obtained.         If patient spikes fever or get shaking chills consider ID consult   DVT prophylaxis: lovenox Code Status: Full code Family Communication: husband bedside Disposition Plan: likely discharge home when improved   Consultants:   none  Procedures:   none  Antimicrobials:   ceftriaxone    Subjective: Patient seen in the emergency department.  Says she feels feverish.    Objective: Vitals:   11/15/16 1152 11/15/16 1200 11/15/16 1218 11/15/16 1541  BP:  127/86 (!) 91/55 127/65  Pulse:  80 86 88  Resp:  18 18   Temp: 97.6 F (36.4 C)  97.9 F (36.6 C) 99.1 F (37.3 C)  TempSrc: Oral  Oral Oral  SpO2:  100% 94%   Weight:   76.8 kg (169 lb 6.4 oz)   Height:   5' 6.5" (1.689 m)     Intake/Output Summary (Last 24 hours) at 11/15/16 1616 Last data filed at 11/15/16 1512  Gross per 24 hour  Intake          2910.41 ml  Output                0 ml  Net          2910.41 ml   Filed Weights   11/14/16 1715 11/15/16 1218  Weight: 73 kg (161 lb) 76.8 kg (169 lb 6.4 oz)    Examination:  General exam: Appears calm and comfortable  Respiratory system: Clear to auscultation. Respiratory effort normal. Cardiovascular system: S1 & S2 heard, RRR. No JVD, murmurs, rubs, gallops or clicks. No pedal edema. Gastrointestinal system:  Abdomen is nondistended, soft and nontender. No organomegaly or masses felt. Normal bowel sounds heard. Central nervous system: Alert and oriented. No focal neurological deficits. Extremities: Symmetric 5 x 5 power. Skin: No rashes, lesions or ulcers Psychiatry: Judgement and insight appear normal. Mood & affect appropriate.     Data Reviewed: I have personally reviewed following labs and imaging studies  CBC:  Recent Labs Lab 11/14/16 1827 11/15/16 0509  WBC 10.1 8.0  NEUTROABS 8.9*  --   HGB 13.3 11.8*  HCT 39.1 35.7*  MCV 88.5 90.2  PLT 137* 132*   Basic Metabolic Panel:  Recent Labs Lab 11/14/16 1827 11/15/16 0509  NA 140 141  K 3.6 3.6  CL 105 111  CO2 25 24  GLUCOSE 100* 101*  BUN 16 15  CREATININE 0.79 0.69  CALCIUM 9.1 8.1*   GFR: Estimated Creatinine Clearance: 73.2 mL/min (by C-G formula based on SCr of 0.69 mg/dL). Liver Function Tests:  Recent Labs Lab 11/14/16 1827 11/15/16 0509  AST 25 18  ALT 18 15  ALKPHOS 58 44  BILITOT 0.8 0.8  PROT 7.1 5.7*  ALBUMIN 4.3 3.3*   No results for  input(s): LIPASE, AMYLASE in the last 168 hours. No results for input(s): AMMONIA in the last 168 hours. Coagulation Profile: No results for input(s): INR, PROTIME in the last 168 hours. Cardiac Enzymes: No results for input(s): CKTOTAL, CKMB, CKMBINDEX, TROPONINI in the last 168 hours. BNP (last 3 results) No results for input(s): PROBNP in the last 8760 hours. HbA1C: No results for input(s): HGBA1C in the last 72 hours. CBG: No results for input(s): GLUCAP in the last 168 hours. Lipid Profile: No results for input(s): CHOL, HDL, LDLCALC, TRIG, CHOLHDL, LDLDIRECT in the last 72 hours. Thyroid Function Tests: No results for input(s): TSH, T4TOTAL, FREET4, T3FREE, THYROIDAB in the last 72 hours. Anemia Panel: No results for input(s): VITAMINB12, FOLATE, FERRITIN, TIBC, IRON, RETICCTPCT in the last 72 hours. Sepsis Labs: No results for input(s): PROCALCITON, LATICACIDVEN in the last 168 hours.  Recent Results (from the past 240 hour(s))  Blood culture (routine x 2)     Status: None (Preliminary result)   Collection Time: 11/14/16  6:27 PM  Result Value Ref Range Status   Specimen Description BLOOD RIGHT ARM DRAWN BY RN  Final   Special Requests   Final    BOTTLES DRAWN AEROBIC AND ANAEROBIC Blood Culture adequate volume   Culture NO GROWTH < 12 HOURS  Final   Report Status PENDING  Incomplete  Blood culture (routine x 2)     Status: None (Preliminary result)   Collection Time: 11/14/16  6:45 PM  Result Value Ref Range Status   Specimen Description BLOOD LEFT HAND  Final   Special Requests   Final    BOTTLES DRAWN AEROBIC AND ANAEROBIC Blood Culture adequate volume   Culture NO GROWTH < 12 HOURS  Final   Report Status PENDING  Incomplete  Fungus culture, blood     Status: None (Preliminary result)   Collection Time: 11/14/16  9:33 PM  Result Value Ref Range Status   Specimen Description BLOOD LEFT HAND  Final   Special Requests   Final    BOTTLES DRAWN AEROBIC AND ANAEROBIC  Blood Culture adequate volume   Culture PENDING  Incomplete   Report Status PENDING  Incomplete         Radiology Studies: Dg Chest 2 View  Result Date: 11/14/2016 CLINICAL DATA:  Weakness EXAM: CHEST  2 VIEW COMPARISON:  02/12/2015 FINDINGS: Mild streaky bibasilar atelectasis. No focal consolidation or effusion. Normal cardiomediastinal silhouette. No pneumothorax. IMPRESSION: Minimal streaky bibasilar atelectasis. Electronically Signed   By: Jasmine PangKim  Fujinaga M.D.   On: 11/14/2016 19:32        Scheduled Meds: . aspirin EC  81 mg Oral Daily  . enoxaparin (LOVENOX) injection  40 mg Subcutaneous Q24H  . LORazepam  0.5 mg Oral TID  . niacin  500 mg Oral Daily  . omega-3 acid ethyl esters  1 g Oral Daily  . vitamin B-12  1,000 mcg Oral Daily  . vitamin E  400 Units Oral Daily   Continuous Infusions: . sodium chloride 125 mL/hr at 11/15/16 1229  . cefTRIAXone (ROCEPHIN)  IV       LOS: 0 days    Time spent: 30 minutes    Katrinka BlazingAlex U Kadolph, MD [facility] Pager [phone]  If 7PM-7AM, please contact night-coverage [email or web address] Password [email or web address] 11/15/2016, 4:16 PM

## 2016-11-16 DIAGNOSIS — N3000 Acute cystitis without hematuria: Secondary | ICD-10-CM | POA: Diagnosis not present

## 2016-11-16 DIAGNOSIS — R531 Weakness: Secondary | ICD-10-CM | POA: Diagnosis not present

## 2016-11-16 NOTE — Progress Notes (Signed)
PROGRESS NOTE    Amber Garcia  ZOX:[id number] DOB: 1950/04/14 DOA: 11/14/2016 PCP: Gareth Morgan, MD    Brief Narrative:  Amber Garcia  is a 66 y.o. female, with history of anxiety disorder came to hospital with sudden onset of shaking chills which started on 4 PM. Patient says that the chills and shaking lasted for about hour and a half, and improved after patient came to ED. She was recently treated for confirmed [facility] spotted fever with doxycycline for 3 weeks. She stopped taking doxycycline 2 weeks ago.  Patient says that she had no symptoms after she stopped taking doxycycline. She complains of dysuria week ago, which resolved after she took Azo for 2 days. At this time she denies any dysuria, urgency or frequency of urination. She denies cough, no chest pain or shortness of breath. No coughing up any phlegm. Denies nausea vomiting or diarrhea.  In the ED, patient was found to have abnormal UA, started on ceftriaxone for UTI.  Urine culture growing e. Coli.  She was concerned about failure of treatment of [place] spotted fever.  Discussed on the phone with ID and patient received adequate treatment.   Assessment & Plan:   Active Problems:   UTI (urinary tract infection)  UTI       Start ceftriaxone per pharmacy consultation       Urine culture growing E. coli  Anxiety        Continue Ativan 0.5 mg by mouth 3 times a day  Fever         Likely from UTI         Patient was recently treated for RMSF         Also had superficial fungal infection, treated with Lamisil.         Urine showing budding yeasts         Blood cultures showing no growth x 2 days         Fungal cultures have also been obtained.         Discussed on phone with ID- patient received adequate treatment for RMSF         Awaiting culture results   DVT prophylaxis: lovenox Code Status: Full code Family Communication: husband and numerous family members beside Disposition Plan: likely  discharge home when improved   Consultants:   none  Procedures:   none  Antimicrobials:   ceftriaxone    Subjective: Patient reports that she feels better today than yesterday.  She does endorse a fever last night in which had an accompanying headache.  Tolerating diet well.  No nausea, vomiting or diarrhea.  No dysuria.  Objective: Vitals:   11/15/16 1541 11/15/16 1949 11/15/16 2010 11/16/16 0527  BP: 127/65  (!) 112/54 107/64  Pulse: 88  89 78  Resp:   18 18  Temp: 99.1 F (37.3 C) (!) 101.7 F (38.7 C) 98.3 F (36.8 C) 98.3 F (36.8 C)  TempSrc: Oral Rectal Oral Oral  SpO2:   97% 95%  Weight:      Height:        Intake/Output Summary (Last 24 hours) at 11/16/16 1542 Last data filed at 11/16/16 0300  Gross per 24 hour  Intake          1522.92 ml  Output              300 ml  Net          1222.92 ml   [employer]  11/14/16 1715 11/15/16 1218  Weight: 73 kg (161 lb) 76.8 kg (169 lb 6.4 oz)    Examination:  General exam: Appears calm and comfortable  Respiratory system: Clear to auscultation. Respiratory effort normal. Cardiovascular system: S1 & S2 heard, RRR. No JVD, murmurs, rubs, gallops or clicks. No pedal edema. Gastrointestinal system: Abdomen is nondistended, soft and nontender. No organomegaly or masses felt. Normal bowel sounds heard. Central nervous system: Alert and oriented. No focal neurological deficits. Extremities: Symmetric 5 x 5 power. Skin: No rashes, lesions or ulcers Psychiatry: Judgement and insight appear normal. Mood & affect appropriate.     Data Reviewed: I have personally reviewed following labs and imaging studies  CBC:  Recent Labs Lab 11/14/16 1827 11/15/16 0509  WBC 10.1 8.0  NEUTROABS 8.9*  --   HGB 13.3 11.8*  HCT 39.1 35.7*  MCV 88.5 90.2  PLT 137* 132*   Basic Metabolic Panel:  Recent Labs Lab 11/14/16 1827 11/15/16 0509  NA 140 141  K 3.6 3.6  CL 105 111  CO2 25 24  GLUCOSE 100* 101*  BUN 16  15  CREATININE 0.79 0.69  CALCIUM 9.1 8.1*   GFR: Estimated Creatinine Clearance: 73.2 mL/min (by C-G formula based on SCr of 0.69 mg/dL). Liver Function Tests:  Recent Labs Lab 11/14/16 1827 11/15/16 0509  AST 25 18  ALT 18 15  ALKPHOS 58 44  BILITOT 0.8 0.8  PROT 7.1 5.7*  ALBUMIN 4.3 3.3*   No results for input(s): LIPASE, AMYLASE in the last 168 hours. No results for input(s): AMMONIA in the last 168 hours. Coagulation Profile: No results for input(s): INR, PROTIME in the last 168 hours. Cardiac Enzymes: No results for input(s): CKTOTAL, CKMB, CKMBINDEX, TROPONINI in the last 168 hours. BNP (last 3 results) No results for input(s): PROBNP in the last 8760 hours. HbA1C: No results for input(s): HGBA1C in the last 72 hours. CBG: No results for input(s): GLUCAP in the last 168 hours. Lipid Profile: No results for input(s): CHOL, HDL, LDLCALC, TRIG, CHOLHDL, LDLDIRECT in the last 72 hours. Thyroid Function Tests: No results for input(s): TSH, T4TOTAL, FREET4, T3FREE, THYROIDAB in the last 72 hours. Anemia Panel: No results for input(s): VITAMINB12, FOLATE, FERRITIN, TIBC, IRON, RETICCTPCT in the last 72 hours. Sepsis Labs: No results for input(s): PROCALCITON, LATICACIDVEN in the last 168 hours.  Recent Results (from the past 240 hour(s))  Urine Culture     Status: Abnormal (Preliminary result)   Collection Time: 11/14/16  5:26 PM  Result Value Ref Range Status   Specimen Description URINE, RANDOM  Final   Special Requests NONE  Final   Culture 80,000 COLONIES/mL ESCHERICHIA COLI (A)  Final   Report Status PENDING  Incomplete  Blood culture (routine x 2)     Status: None (Preliminary result)   Collection Time: 11/14/16  6:27 PM  Result Value Ref Range Status   Specimen Description BLOOD RIGHT ARM DRAWN BY RN  Final   Special Requests   Final    BOTTLES DRAWN AEROBIC AND ANAEROBIC Blood Culture adequate volume   Culture NO GROWTH 2 DAYS  Final   Report Status  PENDING  Incomplete  Blood culture (routine x 2)     Status: None (Preliminary result)   Collection Time: 11/14/16  6:45 PM  Result Value Ref Range Status   Specimen Description BLOOD LEFT HAND  Final   Special Requests   Final    BOTTLES DRAWN AEROBIC AND ANAEROBIC Blood Culture adequate volume  Culture NO GROWTH 2 DAYS  Final   Report Status PENDING  Incomplete  Fungus culture, blood     Status: None (Preliminary result)   Collection Time: 11/14/16  9:33 PM  Result Value Ref Range Status   Specimen Description BLOOD LEFT HAND  Final   Special Requests   Final    BOTTLES DRAWN AEROBIC AND ANAEROBIC Blood Culture adequate volume   Culture PENDING  Incomplete   Report Status PENDING  Incomplete         Radiology Studies: Dg Chest 2 View  Result Date: 11/14/2016 CLINICAL DATA:  Weakness EXAM: CHEST  2 VIEW COMPARISON:  02/12/2015 FINDINGS: Mild streaky bibasilar atelectasis. No focal consolidation or effusion. Normal cardiomediastinal silhouette. No pneumothorax. IMPRESSION: Minimal streaky bibasilar atelectasis. Electronically Signed   By: Jasmine PangKim  Fujinaga M.D.   On: 11/14/2016 19:32        Scheduled Meds: . aspirin EC  81 mg Oral Daily  . enoxaparin (LOVENOX) injection  40 mg Subcutaneous Q24H  . LORazepam  0.5 mg Oral TID  . niacin  500 mg Oral Daily  . omega-3 acid ethyl esters  1 g Oral Daily  . vitamin B-12  1,000 mcg Oral Daily  . vitamin E  400 Units Oral Daily   Continuous Infusions: . cefTRIAXone (ROCEPHIN)  IV Stopped (11/16/16 0130)     LOS: 0 days    Time spent: 30 minutes    Katrinka BlazingAlex U Kadolph, MD [facility] Pager [phone]  If 7PM-7AM, please contact night-coverage [email or web address] Password [email or web address] 11/16/2016, 3:42 PM

## 2016-11-17 DIAGNOSIS — N3000 Acute cystitis without hematuria: Secondary | ICD-10-CM | POA: Diagnosis not present

## 2016-11-17 DIAGNOSIS — R509 Fever, unspecified: Secondary | ICD-10-CM | POA: Diagnosis not present

## 2016-11-17 DIAGNOSIS — R531 Weakness: Secondary | ICD-10-CM | POA: Diagnosis not present

## 2016-11-17 LAB — URINE CULTURE: Culture: 80000 — AB

## 2016-11-17 MED ORDER — CEPHALEXIN 500 MG PO CAPS
500.0000 mg | ORAL_CAPSULE | Freq: Two times a day (BID) | ORAL | 0 refills | Status: AC
Start: 2016-11-17 — End: 2016-11-20

## 2016-11-17 NOTE — Discharge Summary (Signed)
Physician Discharge Summary  Trena PlattMary H Apolinar QMV:[id number] DOB: 08-16-1950 DOA: 11/14/2016  PCP: Gareth MorganKnowlton, Steve, MD  Admit date: 11/14/2016 Discharge date: 11/17/2016  Admitted From:  Home Disposition:   Home  Recommendations for Outpatient Follow-up:  1. Follow up with PCP in 1-2 weeks 2. Repeat UA at follow up 3. Take antibiotics as prescribed 4. Please obtain BMP/CBC in one week  Home Health: None Equipment/Devices: None   Discharge Condition: Stable   CODE STATUS: Full Code  Diet recommendation: Heart Healthy  Brief/Interim Summary: MaryRichis a 66 y.o.female,with history of anxiety disorder came to hospital with sudden onset of shaking chills which started on 4 PM. Patient says that the chills and shaking lasted for about hour and a half, and improved after patient came to ED. She [facility] for 3 weeks. She stopped taking doxycycline 2 weeks ago.  Patient says that she had no symptoms after she stopped taking doxycycline. She complains of dysuria week ago, which resolved after she took Azo for 2 days. At this time she denies any dysuria, urgency or frequency of urination. She denies cough, no chest pain or shortness of breath. No coughing up any phlegm. Denies nausea vomiting or diarrhea.  In the ED, patient was found to have abnormal UA, started on ceftriaxone for UTI.  Urine culture growing e. Coli.  She was concerned about failure of treatment of [place] spotted fever.  Discussed on the phone with ID and patient received adequate treatment.  Patient urine culture grew E. Coli and sensitivities resulted showing pan sensitive.  Transitioned to PO antibiotics on day of discharge.  Patient understands follow up instructions.  Discharge Diagnoses:  Active Problems:   UTI (urinary tract infection)    Discharge Instructions  Discharge Instructions    Call MD for:  difficulty breathing,  headache or visual disturbances    Complete by:  As directed    Call MD for:  extreme fatigue    Complete by:  As directed    Call MD for:  hives    Complete by:  As directed    Call MD for:  persistant dizziness or light-headedness    Complete by:  As directed    Call MD for:  persistant nausea and vomiting    Complete by:  As directed    Call MD for:  redness, tenderness, or signs of infection (pain, swelling, redness, odor or green/yellow discharge around incision site)    Complete by:  As directed    Call MD for:  severe uncontrolled pain    Complete by:  As directed    Call MD for:  temperature >100.4    Complete by:  As directed    Diet - low sodium heart healthy    Complete by:  As directed    Discharge instructions    Complete by:  As directed    Take antibiotics as prescribed Follow up with PCP within this week Repeat UA after cessation of antibiotics   Increase activity slowly    Complete by:  As directed      Allergies as of 11/17/2016      Reactions   Influenza Vaccines    Severe local reaction      Medication List    TAKE these medications   aspirin EC 81 MG tablet Take 81 mg by mouth daily.   cephALEXin 500 MG capsule Commonly known as:  KEFLEX Take 1 capsule (500 mg total)  by mouth 2 (two) times daily.   CoQ-10 200 MG Caps Take 1 capsule by mouth daily.   CRANBERRY PO Take 1 tablet by mouth daily.   FISH OIL PO Take 1 tablet by mouth daily.   Garlic 1000 MG Caps Take 1 capsule by mouth daily.   LORazepam 0.5 MG tablet Commonly known as:  ATIVAN Take 0.5 mg by mouth 3 (three) times daily.   multivitamin with minerals Tabs tablet Take 1 tablet by mouth daily.   naproxen 250 MG tablet Commonly known as:  NAPROSYN Take 500 mg by mouth daily.   niacin 500 MG tablet Take 500 mg by mouth daily.   Potassium 99 MG Tabs Take 1 tablet by mouth daily.   SYSTANE OP Apply 1-2 drops to eye daily as needed (irritation).   VITAMIN B COMPLEX  PO Take 1 tablet by mouth daily.   vitamin B-12 1000 MCG tablet Commonly known as:  CYANOCOBALAMIN Take 1,000 mcg by mouth daily.   vitamin C 1000 MG tablet Take 3,000 mg by mouth daily.   VITAMIN D-3 PO Take 1 tablet by mouth daily.   vitamin E 400 UNIT capsule Take 400 Units by mouth daily.       Allergies  Allergen Reactions  . Influenza Vaccines     Severe local reaction    Consultations:  none   Procedures/Studies: Dg Chest 2 View  Result Date: 11/14/2016 CLINICAL DATA:  Weakness EXAM: CHEST  2 VIEW COMPARISON:  02/12/2015 FINDINGS: Mild streaky bibasilar atelectasis. No focal consolidation or effusion. Normal cardiomediastinal silhouette. No pneumothorax. IMPRESSION: Minimal streaky bibasilar atelectasis. Electronically Signed   By: Jasmine Pang M.D.   On: 11/14/2016 19:32      Subjective: Patient feeling well.  Asked appropriate questions about follow up.  No dysuria and no fevers for >24hours.   Discharge Exam: Vitals:   11/16/16 2036 11/17/16 0509  BP: 113/66 (!) 111/49  Pulse: 74 72  Resp: 20   Temp: 98.1 F (36.7 C) 98 F (36.7 C)  SpO2: 98% 97%   Vitals:   11/16/16 1415 11/16/16 2010 11/16/16 2036 11/17/16 0509  BP:   113/66 (!) 111/49  Pulse:   74 72  Resp:   20   Temp: 100 F (37.8 C)  98.1 F (36.7 C) 98 F (36.7 C)  TempSrc: Axillary  Oral Oral  SpO2:  93% 98% 97%  Weight:      Height:        General: Pt is alert, awake, not in acute distress Cardiovascular: RRR, S1/S2 +, no rubs, no gallops Respiratory: CTA bilaterally, no wheezing, no rhonchi Abdominal: Soft, NT, ND, bowel sounds + Extremities: no edema, no cyanosis    The results of significant diagnostics from this hospitalization (including imaging, microbiology, ancillary and laboratory) are listed below for reference.     Microbiology: Recent Results (from the past 240 hour(s))  Urine Culture     Status: Abnormal   Collection Time: 11/14/16  5:26 PM  Result  Value Ref Range Status   Specimen Description URINE, RANDOM  Final   Special Requests NONE  Final   Culture 80,000 COLONIES/mL ESCHERICHIA COLI (A)  Final   Report Status 11/17/2016 FINAL  Final   Organism ID, Bacteria ESCHERICHIA COLI (A)  Final      Susceptibility   Escherichia coli - MIC*    AMPICILLIN <=2 SENSITIVE Sensitive     CEFAZOLIN <=4 SENSITIVE Sensitive     CEFTRIAXONE <=1 SENSITIVE Sensitive  CIPROFLOXACIN <=0.25 SENSITIVE Sensitive     GENTAMICIN <=1 SENSITIVE Sensitive     IMIPENEM <=0.25 SENSITIVE Sensitive     NITROFURANTOIN <=16 SENSITIVE Sensitive     TRIMETH/SULFA <=20 SENSITIVE Sensitive     AMPICILLIN/SULBACTAM <=2 SENSITIVE Sensitive     PIP/TAZO <=4 SENSITIVE Sensitive     Extended ESBL NEGATIVE Sensitive     * 80,000 COLONIES/mL ESCHERICHIA COLI  Blood culture (routine x 2)     Status: None (Preliminary result)   Collection Time: 11/14/16  6:27 PM  Result Value Ref Range Status   Specimen Description BLOOD RIGHT ARM DRAWN BY RN  Final   Special Requests   Final    BOTTLES DRAWN AEROBIC AND ANAEROBIC Blood Culture adequate volume   Culture NO GROWTH 2 DAYS  Final   Report Status PENDING  Incomplete  Blood culture (routine x 2)     Status: None (Preliminary result)   Collection Time: 11/14/16  6:45 PM  Result Value Ref Range Status   Specimen Description BLOOD LEFT HAND  Final   Special Requests   Final    BOTTLES DRAWN AEROBIC AND ANAEROBIC Blood Culture adequate volume   Culture NO GROWTH 2 DAYS  Final   Report Status PENDING  Incomplete  Fungus culture, blood     Status: None (Preliminary result)   Collection Time: 11/14/16  9:33 PM  Result Value Ref Range Status   Specimen Description BLOOD LEFT HAND  Final   Special Requests   Final    BOTTLES DRAWN AEROBIC AND ANAEROBIC Blood Culture adequate volume   Culture PENDING  Incomplete   Report Status PENDING  Incomplete     Labs: BNP (last 3 results) No results for input(s): BNP in the last  8760 hours. Basic Metabolic Panel:  Recent Labs Lab 11/14/16 1827 11/15/16 0509  NA 140 141  K 3.6 3.6  CL 105 111  CO2 25 24  GLUCOSE 100* 101*  BUN 16 15  CREATININE 0.79 0.69  CALCIUM 9.1 8.1*   Liver Function Tests:  Recent Labs Lab 11/14/16 1827 11/15/16 0509  AST 25 18  ALT 18 15  ALKPHOS 58 44  BILITOT 0.8 0.8  PROT 7.1 5.7*  ALBUMIN 4.3 3.3*   No results for input(s): LIPASE, AMYLASE in the last 168 hours. No results for input(s): AMMONIA in the last 168 hours. CBC:  Recent Labs Lab 11/14/16 1827 11/15/16 0509  WBC 10.1 8.0  NEUTROABS 8.9*  --   HGB 13.3 11.8*  HCT 39.1 35.7*  MCV 88.5 90.2  PLT 137* 132*   Cardiac Enzymes: No results for input(s): CKTOTAL, CKMB, CKMBINDEX, TROPONINI in the last 168 hours. BNP: Invalid input(s): POCBNP CBG: No results for input(s): GLUCAP in the last 168 hours. D-Dimer No results for input(s): DDIMER in the last 72 hours. Hgb A1c No results for input(s): HGBA1C in the last 72 hours. Lipid Profile No results for input(s): CHOL, HDL, LDLCALC, TRIG, CHOLHDL, LDLDIRECT in the last 72 hours. Thyroid function studies No results for input(s): TSH, T4TOTAL, T3FREE, THYROIDAB in the last 72 hours.  Invalid input(s): FREET3 Anemia work up No results for input(s): VITAMINB12, FOLATE, FERRITIN, TIBC, IRON, RETICCTPCT in the last 72 hours. Urinalysis    Component Value Date/Time   COLORURINE YELLOW 11/14/2016 1726   APPEARANCEUR HAZY (A) 11/14/2016 1726   LABSPEC 1.009 11/14/2016 1726   PHURINE 6.0 11/14/2016 1726   GLUCOSEU NEGATIVE 11/14/2016 1726   HGBUR NEGATIVE 11/14/2016 1726   BILIRUBINUR NEGATIVE 11/14/2016  1726   BILIRUBINUR small (A) 02/13/2015 1331   KETONESUR NEGATIVE 11/14/2016 1726   PROTEINUR NEGATIVE 11/14/2016 1726   UROBILINOGEN 0.2 02/13/2015 1331   NITRITE NEGATIVE 11/14/2016 1726   LEUKOCYTESUR MODERATE (A) 11/14/2016 1726   Sepsis Labs Invalid input(s): PROCALCITONIN,  WBC,   LACTICIDVEN Microbiology Recent Results (from the past 240 hour(s))  Urine Culture     Status: Abnormal   Collection Time: 11/14/16  5:26 PM  Result Value Ref Range Status   Specimen Description URINE, RANDOM  Final   Special Requests NONE  Final   Culture 80,000 COLONIES/mL ESCHERICHIA COLI (A)  Final   Report Status 11/17/2016 FINAL  Final   Organism ID, Bacteria ESCHERICHIA COLI (A)  Final      Susceptibility   Escherichia coli - MIC*    AMPICILLIN <=2 SENSITIVE Sensitive     CEFAZOLIN <=4 SENSITIVE Sensitive     CEFTRIAXONE <=1 SENSITIVE Sensitive     CIPROFLOXACIN <=0.25 SENSITIVE Sensitive     GENTAMICIN <=1 SENSITIVE Sensitive     IMIPENEM <=0.25 SENSITIVE Sensitive     NITROFURANTOIN <=16 SENSITIVE Sensitive     TRIMETH/SULFA <=20 SENSITIVE Sensitive     AMPICILLIN/SULBACTAM <=2 SENSITIVE Sensitive     PIP/TAZO <=4 SENSITIVE Sensitive     Extended ESBL NEGATIVE Sensitive     * 80,000 COLONIES/mL ESCHERICHIA COLI  Blood culture (routine x 2)     Status: None (Preliminary result)   Collection Time: 11/14/16  6:27 PM  Result Value Ref Range Status   Specimen Description BLOOD RIGHT ARM DRAWN BY RN  Final   Special Requests   Final    BOTTLES DRAWN AEROBIC AND ANAEROBIC Blood Culture adequate volume   Culture NO GROWTH 2 DAYS  Final   Report Status PENDING  Incomplete  Blood culture (routine x 2)     Status: None (Preliminary result)   Collection Time: 11/14/16  6:45 PM  Result Value Ref Range Status   Specimen Description BLOOD LEFT HAND  Final   Special Requests   Final    BOTTLES DRAWN AEROBIC AND ANAEROBIC Blood Culture adequate volume   Culture NO GROWTH 2 DAYS  Final   Report Status PENDING  Incomplete  Fungus culture, blood     Status: None (Preliminary result)   Collection Time: 11/14/16  9:33 PM  Result Value Ref Range Status   Specimen Description BLOOD LEFT HAND  Final   Special Requests   Final    BOTTLES DRAWN AEROBIC AND ANAEROBIC Blood Culture adequate  volume   Culture PENDING  Incomplete   Report Status PENDING  Incomplete     Time coordinating discharge: 35 minutes  SIGNED:   Katrinka Blazing, MD  [facility] 11/17/2016, 10:44 AM Pager [phone] If 7PM-7AM, please contact night-coverage [email or web address] Password [email or web address]

## 2016-11-19 LAB — CULTURE, BLOOD (ROUTINE X 2)
Culture: NO GROWTH
Special Requests: ADEQUATE

## 2016-11-19 LAB — BLOOD CULTURE ID PANEL (REFLEXED)
ACINETOBACTER BAUMANNII: NOT DETECTED
CANDIDA ALBICANS: NOT DETECTED
CANDIDA PARAPSILOSIS: NOT DETECTED
CANDIDA TROPICALIS: NOT DETECTED
Candida glabrata: NOT DETECTED
Candida krusei: NOT DETECTED
Enterobacter cloacae complex: NOT DETECTED
Enterobacteriaceae species: NOT DETECTED
Enterococcus species: NOT DETECTED
Escherichia coli: NOT DETECTED
Haemophilus influenzae: NOT DETECTED
KLEBSIELLA OXYTOCA: NOT DETECTED
KLEBSIELLA PNEUMONIAE: NOT DETECTED
Listeria monocytogenes: NOT DETECTED
NEISSERIA MENINGITIDIS: NOT DETECTED
PROTEUS SPECIES: NOT DETECTED
Pseudomonas aeruginosa: NOT DETECTED
SERRATIA MARCESCENS: NOT DETECTED
STAPHYLOCOCCUS AUREUS BCID: NOT DETECTED
STAPHYLOCOCCUS SPECIES: NOT DETECTED
STREPTOCOCCUS AGALACTIAE: NOT DETECTED
STREPTOCOCCUS SPECIES: NOT DETECTED
Streptococcus pneumoniae: NOT DETECTED
Streptococcus pyogenes: NOT DETECTED

## 2016-11-19 LAB — ROCKY MTN SPOTTED FVR ABS PNL(IGG+IGM)
RMSF IgG: NEGATIVE
RMSF IgM: 0.37 index (ref 0.00–0.89)

## 2016-11-21 LAB — CULTURE, BLOOD (ROUTINE X 2): Special Requests: ADEQUATE

## 2017-05-26 LAB — FUNGUS CULTURE, BLOOD
CULTURE: NO GROWTH
SPECIAL REQUESTS: ADEQUATE

## 2017-12-10 ENCOUNTER — Other Ambulatory Visit (HOSPITAL_COMMUNITY): Payer: Self-pay | Admitting: Family Medicine

## 2017-12-10 DIAGNOSIS — Z1231 Encounter for screening mammogram for malignant neoplasm of breast: Secondary | ICD-10-CM

## 2017-12-10 DIAGNOSIS — Z780 Asymptomatic menopausal state: Secondary | ICD-10-CM

## 2018-06-27 ENCOUNTER — Encounter (HOSPITAL_COMMUNITY): Payer: Self-pay | Admitting: Emergency Medicine

## 2018-06-27 ENCOUNTER — Other Ambulatory Visit: Payer: Self-pay

## 2018-06-27 ENCOUNTER — Emergency Department (HOSPITAL_COMMUNITY)
Admission: EM | Admit: 2018-06-27 | Discharge: 2018-06-27 | Disposition: A | Discharge status: Home / Self Care | Attending: Emergency Medicine | Admitting: Emergency Medicine

## 2018-06-27 DIAGNOSIS — Y92009 Unspecified place in unspecified non-institutional (private) residence as the place of occurrence of the external cause: Secondary | ICD-10-CM | POA: Diagnosis not present

## 2018-06-27 DIAGNOSIS — S51812A Laceration without foreign body of left forearm, initial encounter: Secondary | ICD-10-CM | POA: Insufficient documentation

## 2018-06-27 DIAGNOSIS — Y9389 Activity, other specified: Secondary | ICD-10-CM | POA: Insufficient documentation

## 2018-06-27 DIAGNOSIS — Y990 Civilian activity done for income or pay: Secondary | ICD-10-CM | POA: Insufficient documentation

## 2018-06-27 DIAGNOSIS — S50872A Other superficial bite of left forearm, initial encounter: Secondary | ICD-10-CM | POA: Diagnosis present

## 2018-06-27 DIAGNOSIS — Z79899 Other long term (current) drug therapy: Secondary | ICD-10-CM | POA: Insufficient documentation

## 2018-06-27 DIAGNOSIS — W540XXA Bitten by dog, initial encounter: Secondary | ICD-10-CM | POA: Diagnosis not present

## 2018-06-27 DIAGNOSIS — S41152A Open bite of left upper arm, initial encounter: Secondary | ICD-10-CM

## 2018-06-27 MED ORDER — AMOXICILLIN-POT CLAVULANATE 875-125 MG PO TABS: 1.0000 | ORAL_TABLET | Freq: Two times a day (BID) | ORAL | 0 refills | Status: AC

## 2018-06-27 NOTE — ED Provider Notes (Signed)
[facility] EMERGENCY DEPARTMENT Provider Note   CSN: [id number] Arrival date & time: 06/27/18  1478    History   Chief Complaint Chief Complaint  Patient presents with  . Animal Bite    HPI Amber Garcia is a 68 y.o. female.     Patient is a 69 year old female who presents to the emergency department following animal bite to the left arm.  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  Today however they knocked her down and 1 of them bit her left arm.  There were no other injuries reported.  The patient applied pressure to stop the bleeding, and came to the emergency department for evaluation.  The patient states that the dogs are up-to-date on their immunizations according to the owner.  The patient states that her last tetanus shot was 5 years her last.  The patient denies being on any anticoagulation medications.  The history is provided by the patient.  Animal Bite    Past Medical History:  Diagnosis Date  . Anxiety     Patient Active Problem List   Diagnosis Date Noted  . UTI (urinary tract infection) 11/15/2016  . Hammer toe of left foot 01/30/2016  . IMPINGEMENT SYNDROME 07/24/2009    Past Surgical History:  Procedure Laterality Date  . CESAREAN SECTION     x2  . COLON SURGERY    . COLONOSCOPY N/A 06/28/2014   Procedure: COLONOSCOPY;  Surgeon: Franky Macho Md, MD;  Location: [facility] SUITE;  Service: Gastroenterology;  Laterality: N/A;  . TUBAL LIGATION       OB History   No obstetric history on file.      Home Medications    Prior to Admission medications   Medication Sig Start Date End Date Taking? Authorizing Provider  Ascorbic Acid (VITAMIN C) 1000 MG tablet Take 3,000 mg by mouth daily.    [provider]  aspirin EC 81 MG tablet Take 81 mg by mouth daily.    [provider]  B Complex Vitamins (VITAMIN B COMPLEX PO) Take 1 tablet by mouth daily.    [provider]  Cholecalciferol (VITAMIN D-3 PO) Take 1 tablet by mouth daily.    [provider]  Coenzyme Q10 (COQ-10) 200 MG CAPS Take 1 capsule by mouth daily.    [provider]  CRANBERRY PO Take 1 tablet by mouth daily.    [provider]  Garlic 1000 MG CAPS Take 1 capsule by mouth daily.    [provider]  LORazepam (ATIVAN) 0.5 MG tablet Take 0.5 mg by mouth 3 (three) times daily.     [provider]  Multiple Vitamin (MULTIVITAMIN WITH MINERALS) TABS tablet Take 1 tablet by mouth daily.    [provider]  naproxen (NAPROSYN) 250 MG tablet Take 500 mg by mouth daily.    [provider]  niacin 500 MG tablet Take 500 mg by mouth daily.    [provider]  Omega-3 Fatty Acids (FISH OIL PO) Take 1 tablet by mouth daily.     [provider]  Polyethyl Glycol-Propyl Glycol (SYSTANE OP) Apply 1-2 drops to eye daily as needed (irritation).    [provider]  Potassium 99 MG TABS Take 1 tablet by mouth daily.    [provider]  vitamin B-12 (CYANOCOBALAMIN) 1000 MCG tablet Take 1,000 mcg by mouth daily.    [provider]  vitamin  E 400 UNIT capsule Take 400 Units by mouth daily.    [provider]    Family History Family History  Problem Relation Age of Onset  . COPD Mother   . Cancer Mother   . Cancer Maternal Grandmother   . Heart disease Maternal Grandfather     Social History Social History   Tobacco Use  . Smoking status: Never Smoker  . Smokeless tobacco: Never Used  Substance Use Topics  . Alcohol use: No  . Drug use: No     Allergies   Influenza vaccines   Review of Systems Review of Systems  Constitutional: Negative for activity change.       All ROS Neg except as noted in HPI  HENT: Negative.   Eyes: Negative for photophobia and discharge.  Respiratory: Negative for cough, shortness of breath and wheezing.   Cardiovascular: Negative for  chest pain and palpitations.  Gastrointestinal: Negative for abdominal pain and blood in stool.  Genitourinary: Negative for dysuria, frequency and hematuria.  Musculoskeletal: Negative for arthralgias, back pain and neck pain.       Injury to forearm  Skin: Negative.   Neurological: Negative for dizziness, seizures and speech difficulty.  Psychiatric/Behavioral: Negative for confusion and hallucinations.     Physical Exam Updated Vital Signs BP (!) 146/89 (BP Location: Right Arm)   Pulse 72   Temp 97.8 F (36.6 C) (Oral)   Resp 18   Ht 5\' 7"  (1.702 m)   Wt 75.8 kg   SpO2 100%   BMI 26.16 kg/m   Physical Exam Vitals signs and nursing note reviewed.  Constitutional:      Appearance: She is well-developed. She is not toxic-appearing.  HENT:     Head: Normocephalic.     Right Ear: Tympanic membrane and external ear normal.     Left Ear: Tympanic membrane and external ear normal.  Eyes:     General: Lids are normal.     Pupils: Pupils are equal, round, and reactive to light.  Neck:     Musculoskeletal: Normal range of motion and neck supple.     Vascular: No carotid bruit.  Cardiovascular:     Rate and Rhythm: Normal rate and regular rhythm.     Pulses: Normal pulses.     Heart sounds: Normal heart sounds.  Pulmonary:     Effort: No respiratory distress.     Breath sounds: Normal breath sounds.  Abdominal:     General: Bowel sounds are normal.     Palpations: Abdomen is soft.     Tenderness: There is no abdominal tenderness. There is no guarding.  Musculoskeletal: Normal range of motion.     Left forearm: She exhibits laceration.       Arms:     Comments: There is full range of motion of the left elbow, wrist, and fingers.  The patient has 2 laceration/skin tear areas.  There are multiple scratches/abrasions just under the second laceration.  There is minimal oozing of blood.  There is no bone or tendon involvement.  Capillary refill is less than 2 seconds.  Radial  pulses 2+.  Lymphadenopathy:     Head:     Right side of head: No submandibular adenopathy.     Left side of head: No submandibular adenopathy.     Cervical: No cervical adenopathy.  Skin:    General: Skin is warm and dry.  Neurological:     Mental Status: She is alert and oriented to person,  place, and time.     Cranial Nerves: No cranial nerve deficit.     Sensory: No sensory deficit.  Psychiatric:        Speech: Speech normal.      ED Treatments / Results  Labs (all labs ordered are listed, but only abnormal results are displayed) Labs Reviewed - No data to display  EKG None  Radiology No results found.  Procedures .Marland Kitchen.Laceration Repair Date/Time: 06/27/2018 11:56 AM Performed by: Ivery QualeBryant, Dayani Winbush, PA-C Authorized by: Ivery QualeBryant, Zayden Maffei, PA-C   Consent:    Consent obtained:  Verbal   Consent given by:  Patient   Risks discussed:  Infection, poor cosmetic result and poor wound healing Universal protocol:    Procedure explained and questions answered to patient or proxy's satisfaction: yes     Immediately prior to procedure, a time out was called: yes     Patient identity confirmed:  Arm band Anesthesia (see MAR for exact dosages):    Anesthesia method:  None Laceration details:    Location:  Shoulder/arm   Shoulder/arm location:  L lower arm   Length (cm):  7.4 Repair type:    Repair type:  Simple Pre-procedure details:    Preparation:  Patient was prepped and draped in usual sterile fashion Exploration:    Hemostasis achieved with:  Direct pressure   Wound exploration: wound explored through full range of motion     Wound extent: no foreign bodies/material noted, no muscle damage noted, no tendon damage noted and no underlying fracture noted   Treatment:    Area cleansed with:  Soap and water   Amount of cleaning:  Standard   Irrigation solution:  Tap water Skin repair:    Repair method:  Steri-Strips   Number of Steri-Strips:  9 Approximation:     Approximation:  Loose Post-procedure details:    Dressing:  Non-adherent dressing   Patient tolerance of procedure:  Tolerated well, no immediate complications   (including critical care time)  Medications Ordered in ED Medications - No data to display   Initial Impression / Assessment and Plan / ED Course  I have reviewed the triage vital signs and the nursing notes.  Pertinent labs & imaging results that were available during my care of the patient were reviewed by me and considered in my medical decision making (see chart for details).          Final Clinical Impressions(s) / ED Diagnoses MDM  Vital signs reviewed. Patient has laceration/skin tears of the left mid forearm area.  These were cleansed and repaired with Steri-Strips.  The patient will be started on Augmentin due to the animal bite.  The patient's tetanus status is up-to-date.  I discussed with the patient the need to see her primary physician or return to the emergency department if any red streaks going up the arm, pus like material from the wounds, fever, chills, nausea or vomiting, or signs of advancing infection.  Patient is in agreement with this plan.   Final diagnoses:  Dog bite of left arm, initial encounter    ED Discharge Orders         Ordered    amoxicillin-clavulanate (AUGMENTIN) 875-125 MG tablet  Every 12 hours     06/27/18 1127           Ivery QualeBryant, Anslie Spadafora, PA-C 06/27/18 1158    Samuel JesterMcManus, Kathleen, [place]DO 07/01/18 [phone]

## 2018-06-27 NOTE — ED Triage Notes (Signed)
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  Dogs are UTD on shots.

## 2018-06-27 NOTE — Discharge Instructions (Addendum)
Your wound was repaired with Steri-Strips.  These will come off in the next 5 to 7 days.  Please do not pull on them.  Please try to limit them getting wet.  Apply a nonstick dressing daily.  Please use Augmentin 2 times daily with a meal.  Please see Dr. Sudie Bailey, or return to the emergency department if any red streaks going up the arm, pus like material from the wound, fever, chills, nausea/vomiting, or signs of advancing infection.

## 2018-06-29 ENCOUNTER — Encounter (HOSPITAL_COMMUNITY): Payer: Self-pay | Admitting: Emergency Medicine

## 2018-06-29 ENCOUNTER — Emergency Department (HOSPITAL_COMMUNITY)
Admission: EM | Admit: 2018-06-29 | Discharge: 2018-06-29 | Disposition: A | Discharge status: Home / Self Care | Attending: Emergency Medicine | Admitting: Emergency Medicine

## 2018-06-29 ENCOUNTER — Emergency Department (HOSPITAL_COMMUNITY)

## 2018-06-29 ENCOUNTER — Other Ambulatory Visit: Payer: Self-pay

## 2018-06-29 DIAGNOSIS — L089 Local infection of the skin and subcutaneous tissue, unspecified: Secondary | ICD-10-CM | POA: Diagnosis present

## 2018-06-29 DIAGNOSIS — Z5189 Encounter for other specified aftercare: Secondary | ICD-10-CM

## 2018-06-29 NOTE — ED Provider Notes (Signed)
[facility] Emergency Department Provider Note MRN:  [id number]  Arrival date & time: 06/29/18     Chief Complaint   Wound Infection   History of Present Illness   Amber Garcia is a 68 y.o. year-old female with no pertinent past medical history presenting to the ED with chief complaint of wound infection.  Patient had a recent ED presentation for a dog bite, was treated with Augmentin.  Patient has taken a total of 4 doses of the Augmentin medication.  Yesterday and today, she is noticed some redness near the wound/banded site.  She was told to come back to the emergency department with any worsening symptoms or redness.  She denies any increase in pain, denies fever, no nausea or vomiting, no chest pain or shortness of breath, no abdominal pain, no other symptoms.  Symptoms are mild, constant, no other exacerbating relieving factors.  Review of Systems  A complete 10 system review of systems was obtained and all systems are negative except as noted in the HPI and PMH.   Patient's Health History    Past Medical History:  Diagnosis Date  . Anxiety     Past Surgical History:  Procedure Laterality Date  . CESAREAN SECTION     x2  . COLON SURGERY    . COLONOSCOPY N/A 06/28/2014   Procedure: COLONOSCOPY;  Surgeon: Franky MachoMark Jenkins Md, MD;  Location: [facility];  Service: Gastroenterology;  Laterality: N/A;  . TUBAL LIGATION      Family History  Problem Relation Age of Onset  . COPD Mother   . Cancer Mother   . Cancer Maternal Grandmother   . Heart disease Maternal Grandfather     Social History   Socioeconomic History  . Marital status: Married    Spouse name: Karren BurlyDwight  . Number of children: 4  . Years of education: 5412  . Highest education level: Not on file  Occupational History  . 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 Financial resource strain: Not on file  . Food insecurity:    Worry: Not on file    Inability: Not  on file  . Transportation needs:    Medical: Not on file    Non-medical: Not on file  Tobacco Use  . Smoking status: Never Smoker  . Smokeless tobacco: Never Used  Substance and Sexual Activity  . Alcohol use: No  . Drug use: No  . Sexual activity: Yes    Partners: Male    Birth control/protection: Surgical, Post-menopausal  Lifestyle  . Physical activity:    Days per week: Not on file    Minutes per session: Not on file  . Stress: Not on file  Relationships  . Social connections:    Talks on phone: Not on file    Gets together: Not on file    Attends religious service: Not on file    Active member of club or organization: Not on file    Attends meetings of clubs or organizations: Not on file    Relationship status: Not on file  . Intimate partner violence:    Fear of current or ex partner: Not on file    Emotionally abused: Not on file    Physically abused: Not on file    Forced sexual activity: Not on file  Other Topics Concern  . Not on file  Social History Narrative   Lives with her husband and their 5 dogs.  Physical Exam  Vital Signs and Nursing Notes reviewed Vitals:   06/29/18 1712  BP: 114/76  Pulse: 84  Resp: 18  Temp: 98.2 F (36.8 C)  SpO2: 100%    CONSTITUTIONAL: Well-appearing, NAD NEURO:  Alert and oriented x 3, no focal deficits EYES:  eyes equal and reactive ENT/NECK:  no LAD, no JVD CARDIO: Regular rate, well-perfused, normal S1 and S2 PULM:  CTAB no wheezing or rhonchi GI/GU:  normal bowel sounds, non-distended, non-tender MSK/SPINE:  No gross deformities, no edema SKIN: Well-healing wound to the left forearm, minimal faint erythema surrounding the wound with no induration PSYCH:  Appropriate speech and behavior  Diagnostic and Interventional Summary    Labs Reviewed - No data to display  DG Forearm Left  Final Result      Medications - No data to display   Procedures Critical Care  ED Course and Medical Decision Making  I  have reviewed the triage vital signs and the nursing notes.  Pertinent labs & imaging results that were available during my care of the patient were reviewed by me and considered in my medical decision making (see below for details).  The wound is well-healing, the patient has normal vital signs, no systemic symptoms, the erythema does not seem consistent with cellulitis or infection, favoring continued mild inflammation or possibly mild allergic reaction to the dressing or soap that she has been using at home.  With this being only 4 doses into her antibiotic regimen, it seems most reasonable to stay the course and monitor the wound closely.  That being said, will obtain screening x-ray as it was not done during her initial visit to exclude retained tooth.  X-ray unremarkable, appropriate for discharge with strict return precautions and continued wound monitoring at home.  Elmer Sow. Pilar Plate, MD [facility] Emergency Medicine [facility] [email or web address]  Final Clinical Impressions(s) / ED Diagnoses     ICD-10-CM   1. Encounter for post-traumatic wound check Z51.89     ED Discharge Orders    None         Sabas Sous, MD 06/29/18 2008

## 2018-06-29 NOTE — ED Notes (Signed)
Unable to obtain Esign. Computer locked up.

## 2018-06-29 NOTE — Discharge Instructions (Addendum)
You were evaluated in the Emergency Department and after careful evaluation, we did not find any emergent condition requiring admission or further testing in the hospital.  Your exam today is overall reassuring.  Your x-ray was normal.  Please continue to take the antibiotics at home and use cold compresses.  Please return to the Emergency Department if you experience any worsening of your condition.  We encourage you to follow up with a primary care provider.  Thank you for allowing [place] to be a part of your care.

## 2018-06-29 NOTE — ED Triage Notes (Signed)
Pt here for recheck of dog bite to left arm from Saturday.  Wound has become red spreading down arm and pt is taking Augmentin.

## 2018-12-22 ENCOUNTER — Ambulatory Visit (INDEPENDENT_AMBULATORY_CARE_PROVIDER_SITE_OTHER): Payer: 59 | Admitting: Orthopedic Surgery

## 2018-12-22 ENCOUNTER — Ambulatory Visit: Payer: 59 | Admitting: Physician Assistant

## 2018-12-22 ENCOUNTER — Other Ambulatory Visit: Payer: Self-pay

## 2018-12-22 ENCOUNTER — Encounter: Payer: Self-pay | Admitting: Physician Assistant

## 2018-12-22 ENCOUNTER — Ambulatory Visit (INDEPENDENT_AMBULATORY_CARE_PROVIDER_SITE_OTHER): Payer: 59

## 2018-12-22 VITALS — Ht 67.0 in | Wt 166.9 lb

## 2018-12-22 DIAGNOSIS — M533 Sacrococcygeal disorders, not elsewhere classified: Secondary | ICD-10-CM | POA: Diagnosis not present

## 2018-12-22 DIAGNOSIS — M25559 Pain in unspecified hip: Secondary | ICD-10-CM | POA: Diagnosis not present

## 2018-12-22 MED ORDER — PREDNISONE 10 MG PO TABS: 20.0000 mg | ORAL_TABLET | Freq: Every day | ORAL | 0 refills | Status: DC

## 2018-12-22 NOTE — Progress Notes (Signed)
Office Visit Note   Patient: Amber Garcia           Date of Birth: 05/18/1950           MRN: [id number] Visit Date: 12/22/2018              Requested by: Gareth Morgan, MD [address],  [place] [address] PCP: Gareth Morgan, MD  Chief Complaint  Patient presents with  . Left Hip - Pain, Injury      HPI: Patient is a 68 year old woman who states she landed in the grass on her left hip.  Patient states this happened last week she is currently weightbearing with crutches complains of pain in the buttocks on the left side denies any groin pain.  Patient states she did go to a chiropractor for adjustment on Thursday and Friday and radiographs were obtained there.  Patient states she has been taking ibuprofen and Naprosyn for pain  She states she is on vitamin D3 supplements 2000 units a day.  Patient states she has pain in the buttocks on the left side with walking.  Assessment & Plan: Visit Diagnoses:  1. Hip pain   2. Sacro-iliac pain     Plan: We will call in a prescription for prednisone she will stop using the ibuprofen and Naprosyn she will continue with her vitamin D3 continue with her crutches she is given a note to be out of work for 2 weeks reevaluate in 2 weeks.  Patient will wean off the prednisone to 10 mg with breakfast and then every other day as her symptoms improved.  Repeat [facility] radiograph of the pelvis at follow-up.  Follow-Up Instructions: Return in about 2 weeks (around 01/05/2019).   Ortho Exam  Patient is alert, oriented, no adenopathy, well-dressed, normal affect, normal respiratory effort. Examination patient does have an antalgic gait she has posterior buttocks pain with weightbearing she has a negative straight leg raise she does have limited range of motion of both hips due to osteoarthritis which is seen on the plain radiographs.  She has internal rotation of the left hip of 0 degrees internal rotation the right hip of 10 degrees external  rotation of about 20 degrees bilaterally range of motion of the hip is not painful.  Patient denies any groin pain at this time.  Patient is maximally tender to palpation over the sacroiliac joint on the left this reproduces her pain.  Radiographs shows no evidence of a fracture.  Imaging: Xr Pelvis 3v Inlet/outlet  Result Date: 12/22/2018 Inlet and outlet views of the pelvis shows no evidence of a hip fracture no rami fractures she does have degenerative changes of the sacroiliac joint.  No sacral fractures.  No images are attached to the encounter.  Labs: Lab Results  Component Value Date   REPTSTATUS 05/26/2017 FINAL 11/14/2016   CULT  11/14/2016    NO GROWTH 84 DAYS Performed at [facility], [address] [place], [place] [address]    [facility] ESCHERICHIA COLI (A) 11/14/2016     Lab Results  Component Value Date   ALBUMIN 3.3 (L) 11/15/2016   ALBUMIN 4.3 11/14/2016    No results found for: MG No results found for: VD25OH  No results found for: PREALBUMIN CBC EXTENDED Latest Ref Rng & Units 11/15/2016 11/14/2016 02/13/2015  WBC 4.0 - 10.5 K/uL 8.0 10.1 8.6  RBC 3.87 - 5.11 MIL/uL 3.96 4.42 4.95  HGB 12.0 - 15.0 g/dL 11.8(L) 13.3 14.4  HCT 36.0 -  46.0 % 35.7(L) 39.1 42.3  PLT 150 - 400 K/uL 132(L) 137(L) -  NEUTROABS 1.7 - 7.7 K/uL - 8.9(H) -  LYMPHSABS 0.7 - 4.0 K/uL - 0.9 -     Body mass index is 26.14 kg/m.  Orders:  Orders Placed This Encounter  Procedures  . XR Pelvis 3V Inlet/Outlet   No orders of the defined types were placed in this encounter.    Procedures: No procedures performed  Clinical Data: No additional findings.  ROS:  All other systems negative, except as noted in the HPI. Review of Systems  Objective: Vital Signs: Ht 5\' 7"  (1.702 m)   Wt 166 lb 14.2 oz (75.7 kg)   BMI 26.14 kg/m   Specialty Comments:  No specialty comments available.  PMFS History: Patient Active Problem List   Diagnosis Date Noted  . UTI (urinary tract  infection) 11/15/2016  . Hammer toe of left foot 01/30/2016  . IMPINGEMENT SYNDROME 07/24/2009   Past Medical History:  Diagnosis Date  . Anxiety     Family History  Problem Relation Age of Onset  . COPD Mother   . Cancer Mother   . Cancer Maternal Grandmother   . Heart disease Maternal Grandfather     Past Surgical History:  Procedure Laterality Date  . CESAREAN SECTION     x2  . COLON SURGERY    . COLONOSCOPY N/A 06/28/2014   Procedure: COLONOSCOPY;  Surgeon: Aviva Signs Md, MD;  Location: [facility];  Service: Gastroenterology;  Laterality: N/A;  . TUBAL LIGATION     Social History   Occupational History  . 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 Smoking status: Never Smoker  . Smokeless tobacco: Never Used  Substance and Sexual Activity  . Alcohol use: No  . Drug use: No  . Sexual activity: Yes    Partners: Male    Birth control/protection: Surgical, Post-menopausal

## 2019-01-05 ENCOUNTER — Encounter: Payer: Self-pay | Admitting: Orthopedic Surgery

## 2019-01-05 ENCOUNTER — Ambulatory Visit: Payer: 59 | Admitting: Orthopedic Surgery

## 2019-01-05 ENCOUNTER — Other Ambulatory Visit: Payer: Self-pay

## 2019-01-05 ENCOUNTER — Ambulatory Visit (INDEPENDENT_AMBULATORY_CARE_PROVIDER_SITE_OTHER): Payer: 59

## 2019-01-05 VITALS — Ht 67.0 in | Wt 166.0 lb

## 2019-01-05 DIAGNOSIS — M25559 Pain in unspecified hip: Secondary | ICD-10-CM | POA: Diagnosis not present

## 2019-01-05 DIAGNOSIS — M533 Sacrococcygeal disorders, not elsewhere classified: Secondary | ICD-10-CM | POA: Diagnosis not present

## 2019-01-05 NOTE — Progress Notes (Signed)
Office Visit Note   Patient: Amber Garcia           Date of Birth: 02/27/1950           MRN: [id number] Visit Date: 01/05/2019              Requested by: Lemmie Evens, MD [address],  [place] [address] PCP: Lemmie Evens, MD  Chief Complaint  Patient presents with  . Left Hip - Pain      HPI: Pleasant 68 y/o her in follow up for her left hip pain. She is now 3 weeks out from a slip in her yard. She has been TDWB in with crutches.  She is feeling a little bit better  Assessment & Plan: Visit Diagnoses:  1. Hip pain   2. Sacro-iliac pain     Plan: Minimally displaced fracture of the lateral aspect of the superior rami on the left side does not communicate to the hip joint.  She will remain touchdown weightbearing for 3 more weeks at her next visit new pelvis x-ray should be taken  Follow-Up Instructions: No follow-ups on file.   Ortho Exam  Patient is alert, oriented, no adenopathy, well-dressed, normal affect, normal respiratory effort. Focused examination demonstrates distal CMS intact compartments soft and nontender tender to palpation over the left hip area  Imaging: No results found. No images are attached to the encounter.  Labs: Lab Results  Component Value Date   REPTSTATUS 05/26/2017 FINAL 11/14/2016   CULT  11/14/2016    NO GROWTH 84 DAYS Performed at [facility], [address] [place], [place] [address]    LABORGA ESCHERICHIA COLI (A) 11/14/2016     Lab Results  Component Value Date   ALBUMIN 3.3 (L) 11/15/2016   ALBUMIN 4.3 11/14/2016    No results found for: MG No results found for: VD25OH  No results found for: PREALBUMIN CBC EXTENDED Latest Ref Rng & Units 11/15/2016 11/14/2016 02/13/2015  WBC 4.0 - 10.5 K/uL 8.0 10.1 8.6  RBC 3.87 - 5.11 MIL/uL 3.96 4.42 4.95  HGB 12.0 - 15.0 g/dL 11.8(L) 13.3 14.4  HCT 36.0 - 46.0 % 35.7(L) 39.1 42.3  PLT 150 - 400 K/uL 132(L) 137(L) -  NEUTROABS 1.7 - 7.7 K/uL - 8.9(H) -  LYMPHSABS  0.7 - 4.0 K/uL - 0.9 -     Body mass index is 26 kg/m.  Orders:  Orders Placed This Encounter  Procedures  . XR Pelvis 1-2 Views   No orders of the defined types were placed in this encounter.    Procedures: No procedures performed  Clinical Data: No additional findings.  ROS:  All other systems negative, except as noted in the HPI. Review of Systems  Objective: Vital Signs: Ht 5\' 7"  (1.702 m)   Wt 166 lb (75.3 kg)   BMI 26.00 kg/m   Specialty Comments:  No specialty comments available.  PMFS History: Patient Active Problem List   Diagnosis Date Noted  . UTI (urinary tract infection) 11/15/2016  . Hammer toe of left foot 01/30/2016  . IMPINGEMENT SYNDROME 07/24/2009   Past Medical History:  Diagnosis Date  . Anxiety     Family History  Problem Relation Age of Onset  . COPD Mother   . Cancer Mother   . Cancer Maternal Grandmother   . Heart disease Maternal Grandfather     Past Surgical History:  Procedure Laterality Date  . CESAREAN SECTION     x2  . COLON SURGERY    .  COLONOSCOPY N/A 06/28/2014   Procedure: COLONOSCOPY;  Surgeon: Franky Macho Md, MD;  Location: [facility];  Service: Gastroenterology;  Laterality: N/A;  . TUBAL LIGATION     Social History   Occupational History  . 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 Smoking status: Never Smoker  . Smokeless tobacco: Never Used  Substance and Sexual Activity  . Alcohol use: No  . Drug use: No  . Sexual activity: Yes    Partners: Male    Birth control/protection: Surgical, Post-menopausal

## 2019-01-11 ENCOUNTER — Telehealth: Payer: Self-pay | Admitting: Orthopedic Surgery

## 2019-01-11 NOTE — Telephone Encounter (Signed)
Patient was called and informed paperwork was filled and signed by Dr Sharol Given today. Patient would like for her FMLA faxed to [facility] at [phone]. Will give to Tammy.

## 2019-01-11 NOTE — Telephone Encounter (Signed)
Patient called and requested call back. Patient states. FMLA paperwork was given to Dr. Sharol Given nurse. Patient phone number is [phone].

## 2019-01-13 ENCOUNTER — Other Ambulatory Visit: Payer: Self-pay | Admitting: Orthopedic Surgery

## 2019-01-13 NOTE — Telephone Encounter (Signed)
Do you want to refill this medication?

## 2019-01-26 ENCOUNTER — Ambulatory Visit (INDEPENDENT_AMBULATORY_CARE_PROVIDER_SITE_OTHER): Payer: 59

## 2019-01-26 ENCOUNTER — Encounter: Payer: Self-pay | Admitting: Physician Assistant

## 2019-01-26 ENCOUNTER — Other Ambulatory Visit: Payer: Self-pay

## 2019-01-26 ENCOUNTER — Ambulatory Visit: Payer: 59 | Admitting: Physician Assistant

## 2019-01-26 VITALS — Ht 67.0 in | Wt 166.0 lb

## 2019-01-26 DIAGNOSIS — M25559 Pain in unspecified hip: Secondary | ICD-10-CM

## 2019-01-26 NOTE — Progress Notes (Signed)
Office Visit Note   Patient: Amber Garcia           Date of Birth: 01-27-1951           MRN: [id number] Visit Date: 01/26/2019              Requested by: Lemmie Evens, MD [address]  [place] [address] PCP: Lemmie Evens, MD  Chief Complaint  Patient presents with  . Left Hip - Follow-up    Minimally displaced fx lateral aspect superior rami       HPI: This is a pleasant 68 year old woman who is now 6 weeks status post left superior rami fracture.  She has been touchdown weightbearing with her crutches.  She said she has not tried much weightbearing she does have some difficulties getting up out of bed and with prolonged sitting in the area otherwise she denies any increased pain or other symptoms  Assessment & Plan: Visit Diagnoses:  1. Hip pain     Plan: She will begin weightbearing as tolerated starting with her crutches.  She will pay attention to walking properly heel-to-toe.  She may wean off the crutches to a cane if needed.  She will follow up in 4 weeks at which time an x-ray of her pelvis should be taken  Follow-Up Instructions: No follow-ups on file.   Ortho Exam  Patient is alert, oriented, no adenopathy, well-dressed, normal affect, normal respiratory effort. No soft tissue swelling in her left lower extremity she is able to fire the muscles of all her lower extremity no weakness distal CMS is intact  Imaging: XR Pelvis 1-2 Views  Result Date: 01/26/2019 Pelvic x-ray was reviewed today..  It demonstrates a minimally displaced fracture of the lateral aspect of the superior rami remains reduced in good position with some bony callus surrounding it  No images are attached to the encounter.  Labs: Lab Results  Component Value Date   REPTSTATUS 05/26/2017 FINAL 11/14/2016   CULT  11/14/2016    NO GROWTH 84 DAYS Performed at [facility] [address], [place], [place] [address]    LABORGA ESCHERICHIA COLI (A) 11/14/2016     Lab Results   Component Value Date   ALBUMIN 3.3 (L) 11/15/2016   ALBUMIN 4.3 11/14/2016    No results found for: MG No results found for: VD25OH  No results found for: PREALBUMIN CBC EXTENDED Latest Ref Rng & Units 11/15/2016 11/14/2016 02/13/2015  WBC 4.0 - 10.5 K/uL 8.0 10.1 8.6  RBC 3.87 - 5.11 MIL/uL 3.96 4.42 4.95  HGB 12.0 - 15.0 g/dL 11.8(L) 13.3 14.4  HCT 36.0 - 46.0 % 35.7(L) 39.1 42.3  PLT 150 - 400 K/uL 132(L) 137(L) -  NEUTROABS 1.7 - 7.7 K/uL - 8.9(H) -  LYMPHSABS 0.7 - 4.0 K/uL - 0.9 -     Body mass index is 26 kg/m.  Orders:  Orders Placed This Encounter  Procedures  . XR Pelvis 1-2 Views   No orders of the defined types were placed in this encounter.    Procedures: No procedures performed  Clinical Data: No additional findings.  ROS:  All other systems negative, except as noted in the HPI. Review of Systems  Objective: Vital Signs: Ht 5\' 7"  (1.702 m)   Wt 166 lb (75.3 kg)   BMI 26.00 kg/m   Specialty Comments:  No specialty comments available.  PMFS History: Patient Active Problem List   Diagnosis Date Noted  . UTI (urinary tract infection) 11/15/2016  .  Hammer toe of left foot 01/30/2016  . IMPINGEMENT SYNDROME 07/24/2009   Past Medical History:  Diagnosis Date  . Anxiety     Family History  Problem Relation Age of Onset  . COPD Mother   . Cancer Mother   . Cancer Maternal Grandmother   . Heart disease Maternal Grandfather     Past Surgical History:  Procedure Laterality Date  . CESAREAN SECTION     x2  . COLON SURGERY    . COLONOSCOPY N/A 06/28/2014   Procedure: COLONOSCOPY;  Surgeon: Franky Macho Md, MD;  Location[facility];  Service: Gastroenterology;  Laterality: N/A;  . TUBAL LIGATION     Social History   Occupational History  . 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 Smoking status: Never Smoker  . Smokeless tobacco: Never Used  Substance and Sexual Activity  . Alcohol use: No  .  Drug use: No  . Sexual activity: Yes    Partners: Male    Birth control/protection: Surgical, Post-menopausal

## 2019-02-23 ENCOUNTER — Encounter: Payer: Self-pay | Admitting: Physician Assistant

## 2019-02-23 ENCOUNTER — Ambulatory Visit: Payer: Self-pay

## 2019-02-23 ENCOUNTER — Ambulatory Visit: Payer: 59 | Admitting: Physician Assistant

## 2019-02-23 ENCOUNTER — Other Ambulatory Visit: Payer: Self-pay

## 2019-02-23 VITALS — Ht 67.0 in | Wt 166.0 lb

## 2019-02-23 DIAGNOSIS — M25559 Pain in unspecified hip: Secondary | ICD-10-CM

## 2019-02-23 NOTE — Progress Notes (Signed)
Office Visit Note   Patient: Amber Garcia           Date of Birth: Nov 10, 1950           MRN: [id number] Visit Date: 02/23/2019              Requested by: Lemmie Evens, MD [address],  [place] [address] PCP: Lemmie Evens, MD  Chief Complaint  Patient presents with  . Pelvis - Follow-up    Left superior rami fx       HPI: Patient presents today 2 months status post left superior rami fracture.  She continues to progress and feel better.  She is in the process of weaning off her cane.  She still gets some pulling sensation when she arises or sits down in a chair  Assessment & Plan: Visit Diagnoses:  1. Hip pain     Plan: She will continue to wean off her cane she is doing quite well I would anticipate 1 more visit in 1 month x-rays of her pelvis at that time  Follow-Up Instructions: No follow-ups on file.   Ortho Exam  Patient is alert, oriented, no adenopathy, well-dressed, normal affect, normal respiratory effort. Left lower extremity neurovascular intact mild antalgic gait ambulating with an assistive device distal CMS intact still some tenderness in the groin  Imaging: No results found. No images are attached to the encounter.  Labs: Lab Results  Component Value Date   REPTSTATUS 05/26/2017 FINAL 11/14/2016   CULT  11/14/2016    NO GROWTH 84 DAYS Performed at [facility], [address], [place], [place] [address]    LABORGA ESCHERICHIA COLI (A) 11/14/2016     Lab Results  Component Value Date   ALBUMIN 3.3 (L) 11/15/2016   ALBUMIN 4.3 11/14/2016    No results found for: MG No results found for: VD25OH  No results found for: PREALBUMIN CBC EXTENDED Latest Ref Rng & Units 11/15/2016 11/14/2016 02/13/2015  WBC 4.0 - 10.5 K/uL 8.0 10.1 8.6  RBC 3.87 - 5.11 MIL/uL 3.96 4.42 4.95  HGB 12.0 - 15.0 g/dL 11.8(L) 13.3 14.4  HCT 36.0 - 46.0 % 35.7(L) 39.1 42.3  PLT 150 - 400 K/uL 132(L) 137(L) -  NEUTROABS 1.7 - 7.7 K/uL - 8.9(H) -   LYMPHSABS 0.7 - 4.0 K/uL - 0.9 -     Body mass index is 26 kg/m.  Orders:  Orders Placed This Encounter  Procedures  . XR HIP UNILAT W OR W/O PELVIS 2-3 VIEWS LEFT   No orders of the defined types were placed in this encounter.    Procedures: No procedures performed  Clinical Data: No additional findings.  ROS:  All other systems negative, except as noted in the HPI. Review of Systems  Objective: Vital Signs: Ht 5\' 7"  (1.702 m)   Wt 166 lb (75.3 kg)   BMI 26.00 kg/m   Specialty Comments:  No specialty comments available.  PMFS History: Patient Active Problem List   Diagnosis Date Noted  . UTI (urinary tract infection) 11/15/2016  . Hammer toe of left foot 01/30/2016  . IMPINGEMENT SYNDROME 07/24/2009   Past Medical History:  Diagnosis Date  . Anxiety     Family History  Problem Relation Age of Onset  . COPD Mother   . Cancer Mother   . Cancer Maternal Grandmother   . Heart disease Maternal Grandfather     Past Surgical History:  Procedure Laterality Date  . CESAREAN SECTION  x2  . COLON SURGERY    . COLONOSCOPY N/A 06/28/2014   Procedure: COLONOSCOPY;  Surgeon: Franky Macho Md, MD;  [facility];  Service: Gastroenterology;  Laterality: N/A;  . TUBAL LIGATION     Social History   Occupational History  . 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 Smoking status: Never Smoker  . Smokeless tobacco: Never Used  Substance and Sexual Activity  . Alcohol use: No  . Drug use: No  . Sexual activity: Yes    Partners: Male    Birth control/protection: Surgical, Post-menopausal

## 2019-03-23 ENCOUNTER — Other Ambulatory Visit: Payer: Self-pay

## 2019-03-23 ENCOUNTER — Ambulatory Visit (INDEPENDENT_AMBULATORY_CARE_PROVIDER_SITE_OTHER): Payer: Medicare Other | Admitting: Physician Assistant

## 2019-03-23 ENCOUNTER — Encounter: Payer: Self-pay | Admitting: Physician Assistant

## 2019-03-23 ENCOUNTER — Ambulatory Visit (INDEPENDENT_AMBULATORY_CARE_PROVIDER_SITE_OTHER): Payer: Medicare Other

## 2019-03-23 VITALS — Ht 67.0 in | Wt 166.0 lb

## 2019-03-23 DIAGNOSIS — M533 Sacrococcygeal disorders, not elsewhere classified: Secondary | ICD-10-CM | POA: Diagnosis not present

## 2019-03-23 DIAGNOSIS — M25552 Pain in left hip: Secondary | ICD-10-CM | POA: Diagnosis not present

## 2019-03-23 DIAGNOSIS — M25559 Pain in unspecified hip: Secondary | ICD-10-CM

## 2019-03-23 NOTE — Progress Notes (Signed)
Office Visit Note   Patient: Amber Garcia           Date of Birth: 03-Aug-1950           MRN: [id number] Visit Date: 03/23/2019              Requested by: Gareth Morgan, MD [address]  [place] [address] PCP: Gareth Morgan, MD  Chief Complaint  Patient presents with  . Left Hip - Follow-up    3 months s/p superior rami fx       HPI: This is a pleasant woman who is now 3 months status post left superior rami fracture.  She is doing well and continues to improve.  Her only complaint is of some pulling in her left groin especially after she has been sitting a bit.  She can go almost all day without her cane but sometimes if she pushes it to the limit she gets back pain that does not radiate down her legs does not cause weakness or cause paresthesias Assessment & Plan: Visit Diagnoses:  1. Sacro-iliac pain   2. Hip pain     Plan: We talked about applying heat when she is sore to help with some of the tightness of her muscles.  She should continue to be active and walk.  She will follow-up in 3 months if she still having any issues or sooner if she finds she is not making progress she would like Follow-Up Instructions: No follow-ups on file.   Ortho Exam  Patient is alert, oriented, no adenopathy, well-dressed, normal affect, normal respiratory effort. Focused examination she has a very mild antalgic gait does not use her cane.  She has in her back this is not over her spine.  Hip range of motion is not painful  Imaging: No results found. No images are attached to the encounter.  Labs: Lab Results  Component Value Date   REPTSTATUS 05/26/2017 FINAL 11/14/2016   CULT  11/14/2016    NO GROWTH 84 DAYS Performed at [facility], [address] [place], [place] [address]    [facility] ESCHERICHIA COLI (A) 11/14/2016     Lab Results  Component Value Date   ALBUMIN 3.3 (L) 11/15/2016   ALBUMIN 4.3 11/14/2016    No results found for: MG No results found for:  VD25OH  No results found for: PREALBUMIN CBC EXTENDED Latest Ref Rng & Units 11/15/2016 11/14/2016 02/13/2015  WBC 4.0 - 10.5 K/uL 8.0 10.1 8.6  RBC 3.87 - 5.11 MIL/uL 3.96 4.42 4.95  HGB 12.0 - 15.0 g/dL 11.8(L) 13.3 14.4  HCT 36.0 - 46.0 % 35.7(L) 39.1 42.3  PLT 150 - 400 K/uL 132(L) 137(L) -  NEUTROABS 1.7 - 7.7 K/uL - 8.9(H) -  LYMPHSABS 0.7 - 4.0 K/uL - 0.9 -     Body mass index is 26 kg/m.  Orders:  Orders Placed This Encounter  Procedures  . XR HIP UNILAT W OR W/O PELVIS 2-3 VIEWS LEFT   No orders of the defined types were placed in this encounter.    Procedures: No procedures performed  Clinical Data: No additional findings.  ROS:  All other systems negative, except as noted in the HPI. Review of Systems  Objective: Vital Signs: Ht 5\' 7"  (1.702 m)   Wt 166 lb (75.3 kg)   BMI 26.00 kg/m   Specialty Comments:  No specialty comments available.  PMFS History: Patient Active Problem List   Diagnosis Date Noted  . UTI (urinary  tract infection) 11/15/2016  . Hammer toe of left foot 01/30/2016  . IMPINGEMENT SYNDROME 07/24/2009   Past Medical History:  Diagnosis Date  . Anxiety     Family History  Problem Relation Age of Onset  . COPD Mother   . Cancer Mother   . Cancer Maternal Grandmother   . Heart disease Maternal Grandfather     Past Surgical History:  Procedure Laterality Date  . CESAREAN SECTION     x2  . COLON SURGERY    . COLONOSCOPY N/A 06/28/2014   Procedure: COLONOSCOPY;  Surgeon: Aviva Signs Md, MD;  Location: [facility];  Service: Gastroenterology;  Laterality: N/A;  . TUBAL LIGATION     Social History   Occupational History  . 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 Smoking status: Never Smoker  . Smokeless tobacco: Never Used  Substance and Sexual Activity  . Alcohol use: No  . Drug use: No  . Sexual activity: Yes    Partners: Male    Birth control/protection: Surgical,  Post-menopausal

## 2020-01-19 ENCOUNTER — Ambulatory Visit (INDEPENDENT_AMBULATORY_CARE_PROVIDER_SITE_OTHER): Payer: Medicare Other

## 2020-01-19 ENCOUNTER — Ambulatory Visit
Admission: EM | Admit: 2020-01-19 | Discharge: 2020-01-19 | Disposition: A | Discharge status: Home / Self Care | Payer: Medicare Other | Attending: Emergency Medicine | Admitting: Emergency Medicine

## 2020-01-19 ENCOUNTER — Other Ambulatory Visit: Payer: Self-pay

## 2020-01-19 DIAGNOSIS — M25531 Pain in right wrist: Secondary | ICD-10-CM

## 2020-01-19 DIAGNOSIS — S8002XA Contusion of left knee, initial encounter: Secondary | ICD-10-CM | POA: Diagnosis not present

## 2020-01-19 DIAGNOSIS — M25562 Pain in left knee: Secondary | ICD-10-CM

## 2020-01-19 DIAGNOSIS — M25462 Effusion, left knee: Secondary | ICD-10-CM

## 2020-01-19 DIAGNOSIS — M25532 Pain in left wrist: Secondary | ICD-10-CM | POA: Diagnosis not present

## 2020-01-19 DIAGNOSIS — M85862 Other specified disorders of bone density and structure, left lower leg: Secondary | ICD-10-CM

## 2020-01-19 DIAGNOSIS — S52614A Nondisplaced fracture of right ulna styloid process, initial encounter for closed fracture: Secondary | ICD-10-CM

## 2020-01-19 DIAGNOSIS — W19XXXA Unspecified fall, initial encounter: Secondary | ICD-10-CM

## 2020-01-19 DIAGNOSIS — S6991XA Unspecified injury of right wrist, hand and finger(s), initial encounter: Secondary | ICD-10-CM | POA: Diagnosis not present

## 2020-01-19 NOTE — ED Triage Notes (Signed)
Pt had fall earlier to days, injured both wrist and left knee . Swelling noted to wrist

## 2020-01-19 NOTE — ED Provider Notes (Signed)
[facility]   [id number] 01/19/20 Arrival Time: 1629   Chief Complaint  Patient presents with  . Fall    SUBJECTIVE: History from: patient.  Amber Garcia is a 69 y.o. female who presented to the urgent care with a complaint of fall that occurred today.  Reports she is having pain on bilateral wrist and left knee.  She denies any pain at this time.  She has tried OTC medications with relief.  Her symptoms are made worse with ROM.  She denies similar symptoms in the past.  Denies chills, fever, nausea, vomiting, diarrhea  ROS: As per HPI.  All other pertinent ROS negative.      Past Medical History:  Diagnosis Date  . Anxiety    Past Surgical History:  Procedure Laterality Date  . CESAREAN SECTION     x2  . COLON SURGERY    . COLONOSCOPY N/A 06/28/2014   Procedure: COLONOSCOPY;  Surgeon: Franky Macho Md, MD;  Location: [facility];  Service: Gastroenterology;  Laterality: N/A;  . TUBAL LIGATION     Allergies  Allergen Reactions  . Influenza Vaccines     Severe local reaction   No current facility-administered medications on file prior to encounter.   Current Outpatient Medications on File Prior to Encounter  Medication Sig Dispense Refill  . amoxicillin-clavulanate (AUGMENTIN) 875-125 MG tablet Take 1 tablet by mouth every 12 (twelve) hours. Please take with food. 14 tablet 0  . Ascorbic Acid (VITAMIN C) 1000 MG tablet Take 3,000 mg by mouth daily.    Marland Kitchen aspirin EC 81 MG tablet Take 81 mg by mouth daily.    . B Complex Vitamins (VITAMIN B COMPLEX PO) Take 1 tablet by mouth daily.    . Cholecalciferol (VITAMIN D-3 PO) Take 1 tablet by mouth daily.    . Coenzyme Q10 (COQ-10) 200 MG CAPS Take 1 capsule by mouth daily.    Marland Kitchen CRANBERRY PO Take 1 tablet by mouth daily.    . Garlic 1000 MG CAPS Take 1 capsule by mouth daily.    Marland Kitchen LORazepam (ATIVAN) 0.5 MG tablet Take 0.5 mg by mouth 3 (three) times daily.     . Multiple Vitamin (MULTIVITAMIN WITH MINERALS) TABS  tablet Take 1 tablet by mouth daily.    . naproxen (NAPROSYN) 250 MG tablet Take 500 mg by mouth daily.    . niacin 500 MG tablet Take 500 mg by mouth daily.    . Omega-3 Fatty Acids (FISH OIL PO) Take 1 tablet by mouth daily.     Bertram Gala Glycol-Propyl Glycol (SYSTANE OP) Apply 1-2 drops to eye daily as needed (irritation).    . Potassium 99 MG TABS Take 1 tablet by mouth daily.    . predniSONE (DELTASONE) 10 MG tablet TAKE 2 TABLETS (20 MG TOTAL) BY MOUTH DAILY WITH BREAKFAST. 60 tablet 0  . vitamin B-12 (CYANOCOBALAMIN) 1000 MCG tablet Take 1,000 mcg by mouth daily.    . vitamin E 400 UNIT capsule Take 400 Units by mouth daily.     Social History   Socioeconomic History  . Marital status: Married    Spouse name: Karren Burly  . Number of children: 4  . Years of education: 35  . Highest education level: Not on file  Occupational History  . 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 Smoking status: Never Smoker  . Smokeless tobacco: Never Used  Substance and Sexual Activity  . Alcohol use:  No  . Drug use: No  . Sexual activity: Yes    Partners: Male    Birth control/protection: Surgical, Post-menopausal  Other Topics Concern  . Not on file  Social History Narrative   Lives with her husband and their 5 dogs.   Social Determinants of Health   Financial Resource Strain: Not on file  Food Insecurity: Not on file  Transportation Needs: Not on file  Physical Activity: Not on file  Stress: Not on file  Social Connections: Not on file  Intimate Partner Violence: Not on file   Family History  Problem Relation Age of Onset  . COPD Mother   . Cancer Mother   . Cancer Maternal Grandmother   . Heart disease Maternal Grandfather     OBJECTIVE:  There were no vitals filed for this visit.   Physical Exam Vitals and nursing note reviewed.  Constitutional:      General: She is not in acute distress.    Appearance: Normal appearance. She is  normal weight. She is not ill-appearing, toxic-appearing or diaphoretic.  HENT:     Head: Normocephalic.  Cardiovascular:     Rate and Rhythm: Normal rate and regular rhythm.     Pulses: Normal pulses.     Heart sounds: Normal heart sounds. No murmur heard. No friction rub. No gallop.   Pulmonary:     Effort: Pulmonary effort is normal. No respiratory distress.     Breath sounds: Normal breath sounds. No stridor. No wheezing, rhonchi or rales.  Chest:     Chest wall: No tenderness.  Musculoskeletal:        General: Signs of injury present.     Right knee: Normal.     Left knee: Tenderness present.     Comments: Patient is able to ambulate and bear weight with pain.  The left knee is with obvious deformity when compared to the right knee.  There is no ecchymosis, open wound, lesion, warmth or swelling present.  Limited range of motion due to pain.  Neurovascular status intact.  The left wrist is without any obvious asymmetry or deformity compared to the right wrist.  There is no ecchymosis, open wound, lesion or warmth present.  Swelling is present on right wrist.  Limited range of motion.  Neurovascular status intact.  Neurological:     Mental Status: She is alert and oriented to person, place, and time.      LABS:  No results found for this or any previous visit (from the past 24 hour(s)).   RADIOLOGY:  DG Wrist Complete Left  Result Date: 01/19/2020 CLINICAL DATA:  Fall today.  Left wrist pain.  Initial encounter. EXAM: LEFT WRIST - COMPLETE 3+ VIEW COMPARISON:  None. FINDINGS: There is no evidence of fracture or dislocation. Generalized osteopenia is seen. 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 IMPRESSION: No acute findings. Electronically Signed   By: Danae Orleans M.D.   On: 01/19/2020 17:32   DG Wrist Complete Right  Result Date: 01/19/2020 CLINICAL DATA:  Fall today. Right wrist injury and pain. Initial encounter. EXAM: RIGHT WRIST - COMPLETE 3+  VIEW COMPARISON:  None. FINDINGS: A nondisplaced fracture is seen through the ulnar styloid process. No other fractures identified. No evidence of dislocation. Generalized osteopenia noted. Moderate to severe osteoarthritis is seen involving the base of the thumb. IMPRESSION: Nondisplaced fracture through the ulnar styloid process. Electronically Signed   By: Danae Orleans M.D.   On: 01/19/2020 17:31  DG Knee Complete 4 Views Left  Result Date: 01/19/2020 CLINICAL DATA:  Fall today. Left knee pain and bruising. Initial encounter. EXAM: LEFT KNEE - COMPLETE 4+ VIEW COMPARISON:  None. FINDINGS: No evidence of fracture or dislocation. Small knee joint effusion is seen. Generalized osteopenia noted. No evidence of arthropathy or other focal bone abnormality. Soft tissues are unremarkable. IMPRESSION: No evidence of fracture or dislocation. Small knee joint effusion. Osteopenia. Electronically Signed   By: Danae Orleans M.D.   On: 01/19/2020 17:33   Right wrist x-ray is positive for nondisplaced fracture of the ulnar styloid.  Left wrist and left knee x-ray were negative for acute abnormality.   I have reviewed the x-ray myself and the radiologist interpretation.  I am in agreement with the radiologist interpretation.   ASSESSMENT & PLAN:  1. Fall, initial encounter   2. Bilateral wrist pain   3. Acute pain of left knee   4. Knee effusion, left   5. Osteopenia of left lower leg   6. Closed nondisplaced fracture of styloid process of right ulna, initial encounter     No orders of the defined types were placed in this encounter.    Discharge instructions   Continue to take OTC Tylenol/ibuprofen as needed for pain Follow RICE instruction that is attached Follow-up with PCP Return or go to ED if you develop any new or worsening of your symptoms  Reviewed expectations re: course of current medical issues. Questions answered. Outlined signs and symptoms indicating need for more acute  intervention. Patient verbalized understanding. After Visit Summary given.         Durward Parcel, FNP 01/19/20 1749

## 2020-01-19 NOTE — Discharge Instructions (Addendum)
Continue to take OTC Tylenol/ibuprofen as needed for pain °Follow RICE instruction that is attached °Follow-up with PCP °Return or go to ED if you develop any new or worsening of your symptoms °

## 2020-01-20 ENCOUNTER — Other Ambulatory Visit: Payer: Self-pay

## 2020-01-20 ENCOUNTER — Encounter: Payer: Self-pay | Admitting: Orthopaedic Surgery

## 2020-01-20 ENCOUNTER — Ambulatory Visit (INDEPENDENT_AMBULATORY_CARE_PROVIDER_SITE_OTHER): Payer: Medicare Other | Admitting: Orthopaedic Surgery

## 2020-01-20 DIAGNOSIS — S52691A Other fracture of lower end of right ulna, initial encounter for closed fracture: Secondary | ICD-10-CM

## 2020-01-20 DIAGNOSIS — S52601A Unspecified fracture of lower end of right ulna, initial encounter for closed fracture: Secondary | ICD-10-CM | POA: Insufficient documentation

## 2020-01-20 NOTE — Progress Notes (Signed)
Office Visit Note   Patient: Amber Garcia           Date of Birth: 03-10-1950           MRN: [id number] Visit Date: 01/20/2020              Requested by: Gareth Morgan, MD [address],  [place] [address] PCP: Gareth Morgan, MD   Assessment & Plan: Visit Diagnoses:  1. Other closed fracture of distal end of right ulna, initial encounter     Plan: Amber Garcia fell yesterday tripping over the dog leash and place both hands against a countertop.  She had immediate onset of right wrist pain.  She was seen in urgent care facility with x-rays demonstrating a nondisplaced fracture of the ulnar styloid.  They placed her in a splint.  She has been comfortable taking only ibuprofen for pain.  I did review the films and do not see any other fracture.  There is some area of sclerosis about the radial metaphysis which could indicate a fracture but she was not tender by exam.  I will place her in a volar wrist splint and check her back in 2 weeks with repeat films.  I have encouraged her to return sooner if there is a problem  Follow-Up Instructions: Return in about 2 weeks (around 02/03/2020).   Orders:  No orders of the defined types were placed in this encounter.  No orders of the defined types were placed in this encounter.     Procedures: No procedures performed   Clinical Data: No additional findings.   Subjective: Chief Complaint  Patient presents with  . Right Wrist - Injury    DOI 01/19/2020  Patient presents today for her right wrist. She tripped over a dog leash yesterday and fell. She caught herself on her hands, but also hit her left knee. She went to an urgent care and had x-rays. She was told that she fractured her right wrist. She is currently in a splint. She states that she has some pain at the mid wrist with movement, but only needing to take Ibuprofen. She is right hand dominant.  I reviewed the x-rays on the PACS system and agree with the distal ulnar  styloid nondisplaced fracture.  No other abnormalities other than some arthritis at the base of the thumb  HPI  Review of Systems   Objective: Vital Signs: Ht 5\' 7"  (1.702 m)   Wt 166 lb (75.3 kg)   BMI 26.00 kg/m   Physical Exam Constitutional:      Appearance: She is well-developed and well-nourished.  HENT:     Mouth/Throat:     Mouth: Oropharynx is clear and moist.  Eyes:     Extraocular Movements: EOM normal.     Pupils: Pupils are equal, round, and reactive to light.  Pulmonary:     Effort: Pulmonary effort is normal.  Skin:    General: Skin is warm and dry.  Neurological:     Mental Status: She is alert and oriented to person, place, and time.  Psychiatric:        Mood and Affect: Mood and affect normal.        Behavior: Behavior normal.     Ortho Exam awake alert and oriented x3.  Comfortable sitting.  Right wrist with areas of ecchymosis along the ulnar aspect with tenderness over the ulnar styloid.  No obvious deformity.  No swelling of the digits.  Able to make  a fist and release.  No tenderness over the distal radius.  Neurologically intact Specialty Comments:  No specialty comments available.  Imaging: DG Wrist Complete Left  Result Date: 01/19/2020 CLINICAL DATA:  Fall today.  Left wrist pain.  Initial encounter. EXAM: LEFT WRIST - COMPLETE 3+ VIEW COMPARISON:  None. FINDINGS: There is no evidence of fracture or dislocation. Generalized osteopenia is seen. 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 IMPRESSION: No acute findings. Electronically Signed   By: Danae Orleans M.D.   On: 01/19/2020 17:32   DG Wrist Complete Right  Result Date: 01/19/2020 CLINICAL DATA:  Fall today. Right wrist injury and pain. Initial encounter. EXAM: RIGHT WRIST - COMPLETE 3+ VIEW COMPARISON:  None. FINDINGS: A nondisplaced fracture is seen through the ulnar styloid process. No other fractures identified. No evidence of dislocation. Generalized osteopenia  noted. Moderate to severe osteoarthritis is seen involving the base of the thumb. IMPRESSION: Nondisplaced fracture through the ulnar styloid process. Electronically Signed   By: Danae Orleans M.D.   On: 01/19/2020 17:31   DG Knee Complete 4 Views Left  Result Date: 01/19/2020 CLINICAL DATA:  Fall today. Left knee pain and bruising. Initial encounter. EXAM: LEFT KNEE - COMPLETE 4+ VIEW COMPARISON:  None. FINDINGS: No evidence of fracture or dislocation. Small knee joint effusion is seen. Generalized osteopenia noted. No evidence of arthropathy or other focal bone abnormality. Soft tissues are unremarkable. IMPRESSION: No evidence of fracture or dislocation. Small knee joint effusion. Osteopenia. Electronically Signed   By: Danae Orleans M.D.   On: 01/19/2020 17:33     PMFS History: Patient Active Problem List   Diagnosis Date Noted  . Right distal ulnar fracture 01/20/2020  . UTI (urinary tract infection) 11/15/2016  . Hammer toe of left foot 01/30/2016  . IMPINGEMENT SYNDROME 07/24/2009   Past Medical History:  Diagnosis Date  . Anxiety     Family History  Problem Relation Age of Onset  . COPD Mother   . Cancer Mother   . Cancer Maternal Grandmother   . Heart disease Maternal Grandfather     Past Surgical History:  Procedure Laterality Date  . CESAREAN SECTION     x2  . COLON SURGERY    . COLONOSCOPY N/A 06/28/2014   Procedure: COLONOSCOPY;  Surgeon: Franky Macho Md, MD;  Location: [facility];  Service: Gastroenterology;  Laterality: N/A;  . TUBAL LIGATION     Social History   Occupational History  . 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 Smoking status: Never Smoker  . Smokeless tobacco: Never Used  Substance and Sexual Activity  . Alcohol use: No  . Drug use: No  . Sexual activity: Yes    Partners: Male    Birth control/protection: Surgical, Post-menopausal

## 2020-02-03 ENCOUNTER — Ambulatory Visit (INDEPENDENT_AMBULATORY_CARE_PROVIDER_SITE_OTHER): Payer: Medicare Other | Admitting: Orthopaedic Surgery

## 2020-02-03 ENCOUNTER — Other Ambulatory Visit: Payer: Self-pay

## 2020-02-03 ENCOUNTER — Ambulatory Visit (INDEPENDENT_AMBULATORY_CARE_PROVIDER_SITE_OTHER): Payer: Medicare Other

## 2020-02-03 ENCOUNTER — Encounter: Payer: Self-pay | Admitting: Orthopaedic Surgery

## 2020-02-03 DIAGNOSIS — S52691A Other fracture of lower end of right ulna, initial encounter for closed fracture: Secondary | ICD-10-CM

## 2020-02-03 NOTE — Progress Notes (Signed)
Office Visit Note   Patient: Amber Garcia           Date of Birth: July 31, 1950           MRN: [id number] Visit Date: 02/03/2020              Requested by: Gareth Morgan, MD [address]  [place] [address] PCP: Gareth Morgan, MD   Assessment & Plan: Visit Diagnoses:  1. Other closed fracture of distal end of right ulna, initial encounter     Plan: 2-week status post right wrist injury with a nondisplaced ulnar styloid fracture.  X-rays reveal excellent position of the fracture.  There may be a nondisplaced fracture of the distal radius based on her mild tenderness which she did not have when I initially evaluated her.  X-rays are anatomic.  We will wear the splint for another 2 weeks and then return.  No reason to repeat films unless there is a change  Follow-Up Instructions: Return in about 2 weeks (around 02/17/2020).   Orders:  Orders Placed This Encounter  Procedures  . XR Wrist 2 Views Right   No orders of the defined types were placed in this encounter.     Procedures: No procedures performed   Clinical Data: No additional findings.   Subjective: Chief Complaint  Patient presents with  . Right Wrist - Fracture, Follow-up  2-week status post injury to the right wrist and doing quite well.  She notes that she is styling her hair and taking a bath without any problems or limitations in regards to her right wrist.  Otherwise when she is up and about she wears her splint.  No numbness or tingling or swelling of her digits  HPI  Review of Systems   Objective: Vital Signs: There were no vitals taken for this visit.  Physical Exam  Ortho Exam right wrist with full flexion extension pronation supination compared to the asymptomatic left wrist.  No swelling of her fingers.  Neurologically intact.  Little bit of tenderness over the distal radius and radiocarpal joint but no ecchymosis or erythema.  No tenderness over the distal ulna today  Specialty  Comments:  No specialty comments available.  Imaging: XR Wrist 2 Views Right  Result Date: 02/03/2020 Wrist obtained in 2 projections and compared to films performed 2 weeks ago.  There is a nondisplaced ulnar styloid fracture.  Could be a nondisplaced fracture of the distal radius that is nonarticular but certainly in anatomic position    PMFS History: Patient Active Problem List   Diagnosis Date Noted  . Right distal ulnar fracture 01/20/2020  . UTI (urinary tract infection) 11/15/2016  . Hammer toe of left foot 01/30/2016  . IMPINGEMENT SYNDROME 07/24/2009   Past Medical History:  Diagnosis Date  . Anxiety     Family History  Problem Relation Age of Onset  . COPD Mother   . Cancer Mother   . Cancer Maternal Grandmother   . Heart disease Maternal Grandfather     Past Surgical History:  Procedure Laterality Date  . CESAREAN SECTION     x2  . COLON SURGERY    . COLONOSCOPY N/A 06/28/2014   Procedure: COLONOSCOPY;  Surgeon: Franky Macho Md, MD;  Location: [facility];  Service: Gastroenterology;  Laterality: N/A;  . TUBAL LIGATION     Social History   Occupational History  . 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 Smoking  status: Never Smoker  . Smokeless tobacco: Never Used  Substance and Sexual Activity  . Alcohol use: No  . Drug use: No  . Sexual activity: Yes    Partners: Male    Birth control/protection: Surgical, Post-menopausal     Valeria Batman, MD   Note - This record has been created using Animal nutritionist.  Chart creation errors have been sought, but may not always  have been located. Such creation errors do not reflect on  the standard of medical care.

## 2020-02-17 ENCOUNTER — Ambulatory Visit (INDEPENDENT_AMBULATORY_CARE_PROVIDER_SITE_OTHER): Payer: Medicare Other | Admitting: Orthopaedic Surgery

## 2020-02-17 ENCOUNTER — Encounter: Payer: Self-pay | Admitting: Orthopaedic Surgery

## 2020-02-17 ENCOUNTER — Other Ambulatory Visit: Payer: Self-pay

## 2020-02-17 DIAGNOSIS — S52691A Other fracture of lower end of right ulna, initial encounter for closed fracture: Secondary | ICD-10-CM

## 2020-02-17 NOTE — Progress Notes (Signed)
Office Visit Note   Patient: Amber Garcia           Date of Birth: 1950/03/09           MRN: [id number] Visit Date: 02/17/2020              Requested by: Gareth Morgan, MD [address],  [place] [address] PCP: Gareth Morgan, MD   Assessment & Plan: Visit Diagnoses:  1. Other closed fracture of distal end of right ulna, initial encounter     Plan: 1 month status post right ulnar styloid fracture with little if any displacement.  There was a possibility of a nondisplaced metaphyseal fracture of the radius on films performed 2 weeks after the injury.  Not having any tenderness today.  Films not necessary.  Basically has full range of motion.  Discontinue the splint unless she is out and about the bad weather I will check her back in 2 weeks if she is having any concerns or problems  Follow-Up Instructions: Return if symptoms worsen or fail to improve.   Orders:  No orders of the defined types were placed in this encounter.  No orders of the defined types were placed in this encounter.     Procedures: No procedures performed   Clinical Data: No additional findings.   Subjective: Chief Complaint  Patient presents with  . Right Wrist - Pain, Follow-up  1 month status post injury with an ulnar styloid fracture possibly a distal radius fracture.  Presently doing quite well with little if any discomfort.  No related numbness or tingling or pain  HPI  Review of Systems   Objective: Vital Signs: There were no vitals taken for this visit.  Physical Exam  Ortho Exam right wrist with no tenderness over the distal radius or ulna.  Skin intact.  No edema.  Neurologically intact.  Full range of motion of her fingers.  Full pronation supination flexion extension.  No deformity  Specialty Comments:  No specialty comments available.  Imaging: No results found.   PMFS History: Patient Active Problem List   Diagnosis Date Noted  . Right distal ulnar fracture  01/20/2020  . UTI (urinary tract infection) 11/15/2016  . Hammer toe of left foot 01/30/2016  . IMPINGEMENT SYNDROME 07/24/2009   Past Medical History:  Diagnosis Date  . Anxiety     Family History  Problem Relation Age of Onset  . COPD Mother   . Cancer Mother   . Cancer Maternal Grandmother   . Heart disease Maternal Grandfather     Past Surgical History:  Procedure Laterality Date  . CESAREAN SECTION     x2  . COLON SURGERY    . COLONOSCOPY N/A 06/28/2014   Procedure: COLONOSCOPY;  Surgeon: Franky Macho Md, MD;  Location: [facility];  Service: Gastroenterology;  Laterality: N/A;  . TUBAL LIGATION     Social History   Occupational History  . 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 Smoking status: Never Smoker  . Smokeless tobacco: Never Used  Substance and Sexual Activity  . Alcohol use: No  . Drug use: No  . Sexual activity: Yes    Partners: Male    Birth control/protection: Surgical, Post-menopausal     Valeria Batman, MD   Note - This record has been created using Animal nutritionist.  Chart creation errors have been sought, but may not always  have been located. Such creation  errors do not reflect on  the standard of medical care.

## 2020-03-02 ENCOUNTER — Ambulatory Visit: Payer: Medicare Other | Admitting: Orthopaedic Surgery

## 2021-05-30 ENCOUNTER — Other Ambulatory Visit: Payer: Self-pay | Admitting: Physician Assistant

## 2021-05-30 ENCOUNTER — Other Ambulatory Visit: Payer: Self-pay | Admitting: Rheumatology

## 2021-05-30 ENCOUNTER — Other Ambulatory Visit (HOSPITAL_COMMUNITY): Payer: Self-pay | Admitting: Physician Assistant

## 2021-05-30 DIAGNOSIS — R5383 Other fatigue: Secondary | ICD-10-CM

## 2021-05-30 DIAGNOSIS — R519 Headache, unspecified: Secondary | ICD-10-CM

## 2021-05-30 DIAGNOSIS — R42 Dizziness and giddiness: Secondary | ICD-10-CM

## 2021-06-15 ENCOUNTER — Ambulatory Visit (HOSPITAL_COMMUNITY)
Admission: RE | Admit: 2021-06-15 | Discharge: 2021-06-15 | Disposition: A | Discharge status: Home / Self Care | Payer: Medicare HMO | Source: Ambulatory Visit | Attending: Physician Assistant | Admitting: Physician Assistant

## 2021-06-15 DIAGNOSIS — R519 Headache, unspecified: Secondary | ICD-10-CM | POA: Diagnosis present

## 2021-06-15 DIAGNOSIS — R5383 Other fatigue: Secondary | ICD-10-CM

## 2021-06-15 DIAGNOSIS — R42 Dizziness and giddiness: Secondary | ICD-10-CM | POA: Diagnosis present

## 2022-06-16 IMAGING — MR MR HEAD W/O CM
14 series · 48 of 48 positions shown · non-contrast
Comparison: None Available.

CLINICAL DATA: Dizziness and non intractable headache

EXAM:
MRI HEAD WITHOUT CONTRAST
TECHNIQUE: Multiplanar, multiecho pulse sequences of the brain and surrounding
structures were obtained without intravenous contrast.

[Series 5: DWI · axial · 3.0mm · 0.77mm/px · z∈[-43,+103]mm · 4 of 50 slices shown (1 of 6)]
[im 1/50]
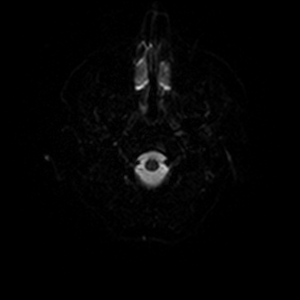
[im 17/50]
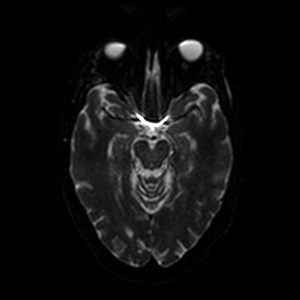
[im 33/50]
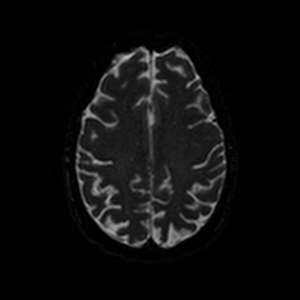
[im 50/50]
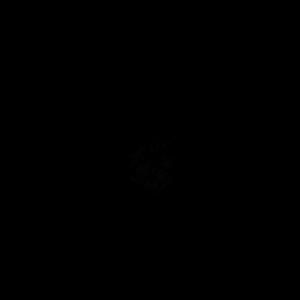

[Series 5: DWI · axial · 3.0mm · 0.77mm/px · z∈[-43,+103]mm · 4 of 50 slices shown (2 of 6)]
[im 1/50]
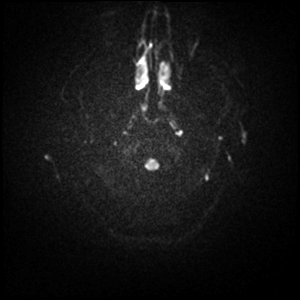
[im 17/50]
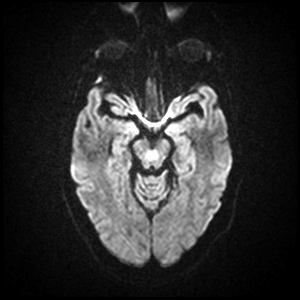
[im 33/50]
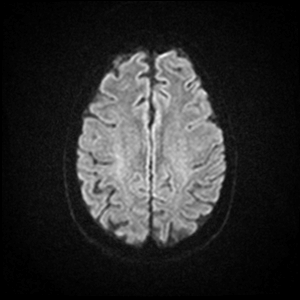
[im 50/50]
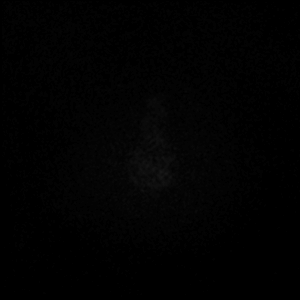

[Series 6: DWI · axial · 3.0mm · 0.77mm/px · z∈[-43,+100]mm · 3 of 49 slices shown (3 of 6)]
[im 1/49]
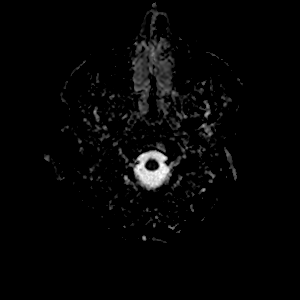
[im 25/49]
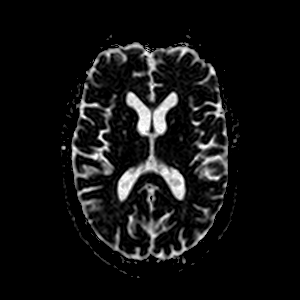
[im 49/49]
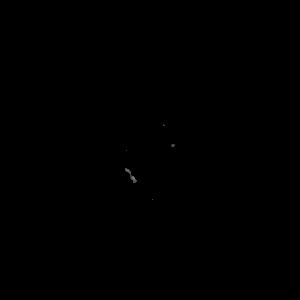

[Series 7: DWI · coronal · 5.0mm · 0.88mm/px · 2 of 28 slices shown (4 of 6)]
[im 1/28]
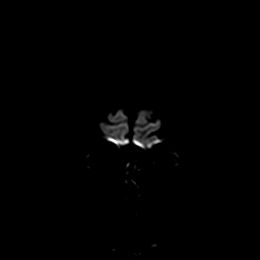
[im 28/28]
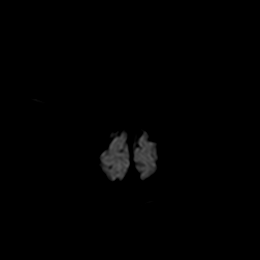

[Series 7: DWI · coronal · 5.0mm · 0.88mm/px · 2 of 28 slices shown (5 of 6)]
[im 1/28]
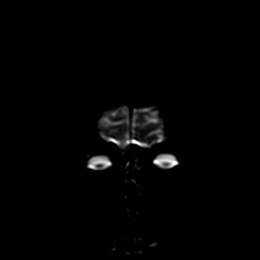
[im 28/28]
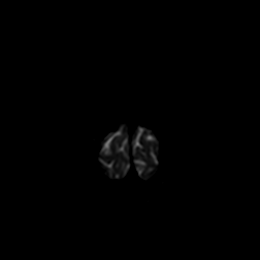

[Series 8: DWI · coronal · 5.0mm · 0.88mm/px · 2 of 28 slices shown (6 of 6)]
[im 1/28]
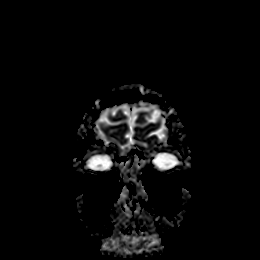
[im 28/28]
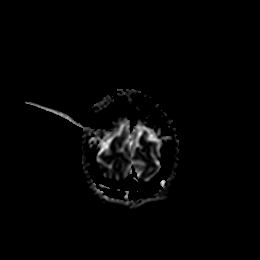

[Series 9: T1 · sagittal · 5.0mm · 0.75mm/px · 1 of 21 slices shown (1 of 2)]
[im 1/21]
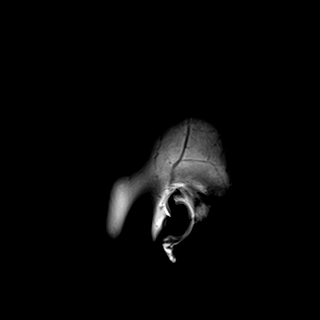

[Series 10: T2 · axial · 5.0mm · 0.72mm/px · z∈[-47,+107]mm · 2 of 23 slices shown (1 of 2)]
[im 1/23]
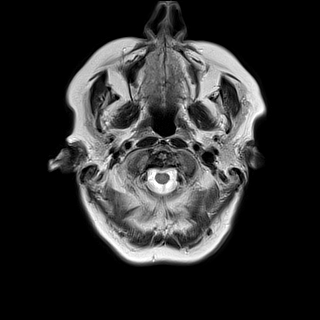
[im 23/23]
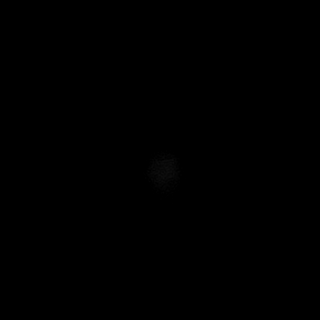

[Series 11: mag_images · axial · 3.0mm · 0.90mm/px · z∈[-57,+119]mm · 4 of 60 slices shown]
[im 1/60]
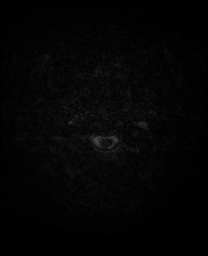
[im 20/60]
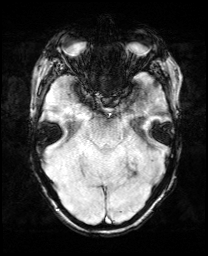
[im 40/60]
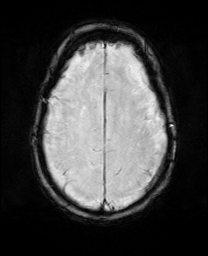
[im 60/60]
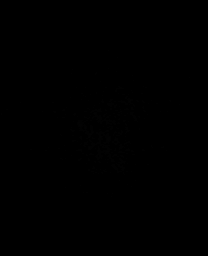

[Series 12: pha_images · axial · 3.0mm · 0.90mm/px · z∈[-57,+119]mm · 4 of 59 slices shown]
[im 1/59]
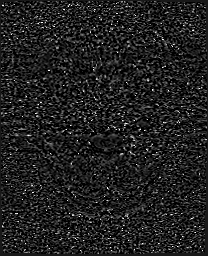
[im 20/59]
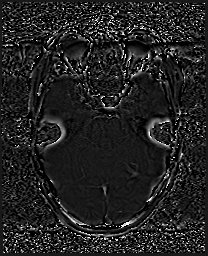
[im 39/59]
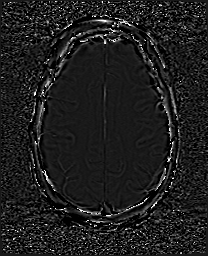
[im 59/59]
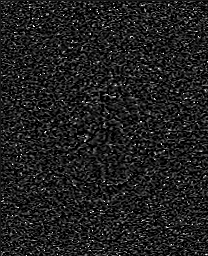

[Series 13: swi_images · axial · 3.0mm · 0.90mm/px · z∈[-57,+119]mm · 4 of 60 slices shown]
[im 1/60]
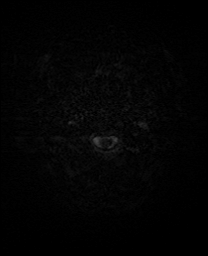
[im 20/60]
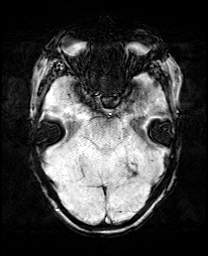
[im 40/60]
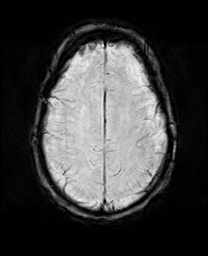
[im 60/60]
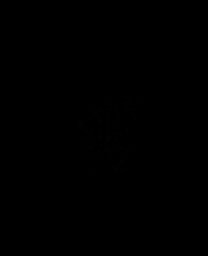

[Series 15: FLAIR · axial · 3.0mm · 0.45mm/px · z∈[-42,+104]mm · 3 of 50 slices shown]
[im 1/50]
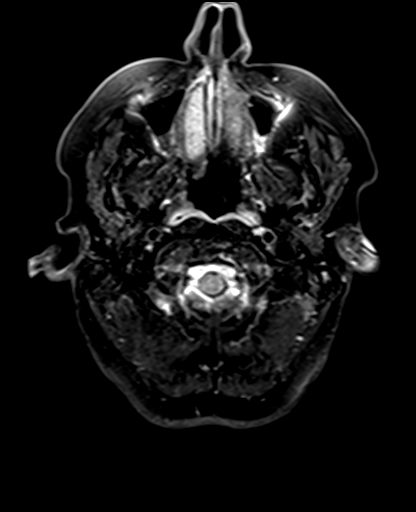
[im 25/50]
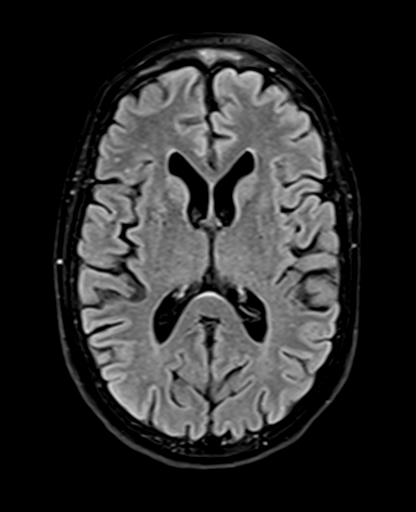
[im 50/50]
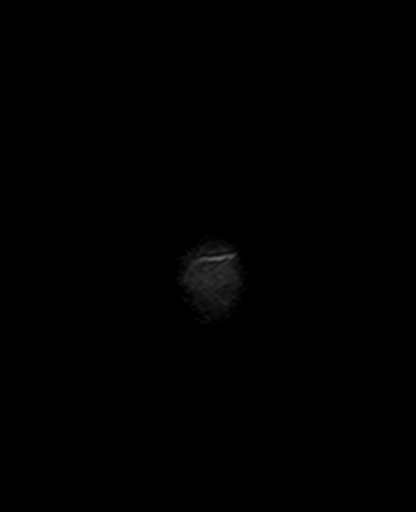

[Series 16: T1 · axial · 1.0mm · 0.98mm/px · z∈[-57,+113]mm · 11 of 167 slices shown (2 of 2)]
[im 1/167]
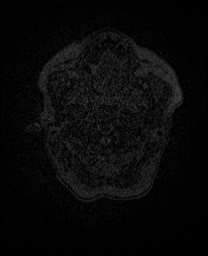
[im 17/167]
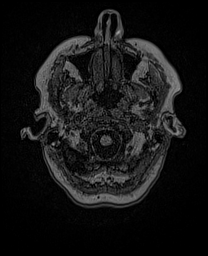
[im 34/167]
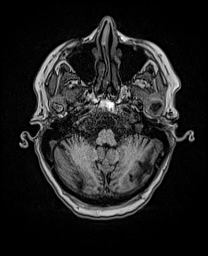
[im 50/167]
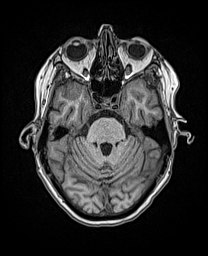
[im 67/167]
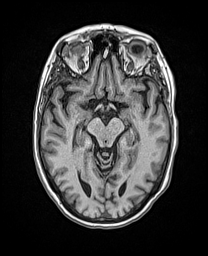
[im 84/167]
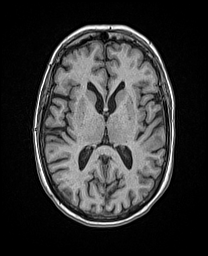
[im 100/167]
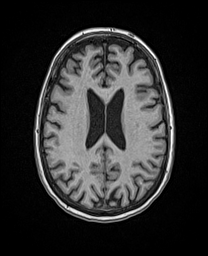
[im 117/167]
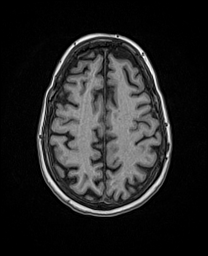
[im 133/167]
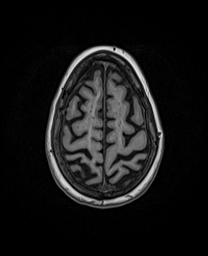
[im 150/167]
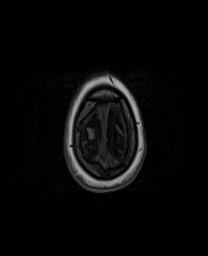
[im 167/167]
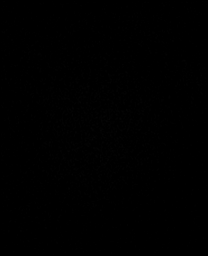

[Series 17: T2 · coronal · 5.0mm · 0.72mm/px · 2 of 28 slices shown (2 of 2)]
[im 1/28]
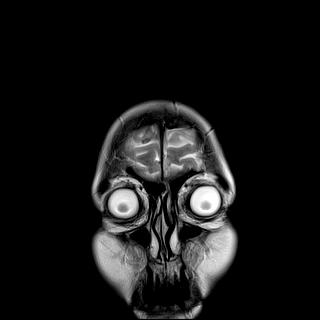
[im 28/28]
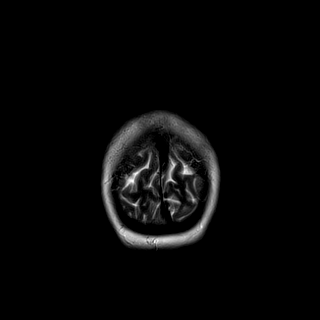

[48 of 48 positions shown; findings below may reference images not displayed]

FINDINGS: Brain: No acute infarct, mass effect or extra-axial collection. No
acute or chronic hemorrhage. There is multifocal hyperintense
T2-weighted signal within the white matter. Parenchymal volume and
CSF spaces are normal. Old left cerebellar small vessel infarcts.
The midline structures are normal.

Vascular: Major flow voids are preserved.

Skull and upper cervical spine: Normal calvarium and skull base.
Visualized upper cervical spine and soft tissues are normal.

Sinuses/Orbits:No paranasal sinus fluid levels or advanced mucosal
thickening. No mastoid or middle ear effusion. Normal orbits.
IMPRESSION: 1. No acute intracranial abnormality.
2. Findings of chronic ischemic microangiopathy and old left
cerebellar small vessel infarcts.
